# Patient Record
Sex: Female | Born: 1995 | Race: White | Hispanic: No | Marital: Single | Smoking: Light tobacco smoker
Health system: Southern US, Community
[De-identification: ages and names within clinical notes are randomized; demographics above are authoritative.]

---
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
## 2018-03-17 ENCOUNTER — Encounter: Payer: Self-pay | Admitting: Emergency Medicine

## 2018-03-17 ENCOUNTER — Other Ambulatory Visit: Payer: Self-pay

## 2018-03-17 ENCOUNTER — Emergency Department
Admission: EM | Admit: 2018-03-17 | Discharge: 2018-03-17 | Disposition: A | Discharge status: Home / Self Care | Payer: Self-pay | Attending: Emergency Medicine | Admitting: Emergency Medicine

## 2018-03-17 DIAGNOSIS — J111 Influenza due to unidentified influenza virus with other respiratory manifestations: Secondary | ICD-10-CM

## 2018-03-17 DIAGNOSIS — J1089 Influenza due to other identified influenza virus with other manifestations: Secondary | ICD-10-CM | POA: Insufficient documentation

## 2018-03-17 MED ORDER — OSELTAMIVIR PHOSPHATE 75 MG PO CAPS: 75.0000 mg | ORAL_CAPSULE | Freq: Two times a day (BID) | ORAL | 0 refills | Status: DC

## 2018-03-17 NOTE — ED Notes (Signed)
Pt states that her daughter was treated for the flu on Monday and she suspects that she may have it now as well. Pt states stomach pain, nausea, vomiting, and a cough. Pt is alert and oriented x 4.

## 2018-03-17 NOTE — ED Provider Notes (Signed)
[facility] Emergency Department Provider Note  ____________________________________________  Time seen: Approximately 7:40 PM  I have reviewed the triage vital signs and the nursing notes.   HISTORY  Chief Complaint Fever; Influenza; and Nausea    HPI Sara Castillo is a 23 y.o. female who presents the emergency department with body aches, fevers and chills, nasal congestion, sore throat, cough, emesis.  Patient reports that her daughter recently was diagnosed with influenza.  Patient has had symptoms x1 day.  Patient is concerned that she may have influenza as well.  She denies any headache, visual change, chest pain.  No medications prior to arrival.  No other complaints at this time.    History reviewed. No pertinent past medical history.  There are no active problems to display for this patient.   History reviewed. No pertinent surgical history.  Prior to Admission medications   Medication Sig Start Date End Date Taking? Authorizing Provider  oseltamivir (TAMIFLU) 75 MG capsule Take 1 capsule (75 mg total) by mouth 2 (two) times daily. 03/17/18   , Delorise Royals, PA-C    Allergies Patient has no known allergies.  No family history on file.  Social History Social History   Tobacco Use  . Smoking status: Never Smoker  . Smokeless tobacco: Never Used  Substance Use Topics  . Alcohol use: Not on file  . Drug use: Not on file     Review of Systems  Constitutional: Positive fever/chills.  Positive for body aches. Eyes: No visual changes. No discharge ENT: Positive for nasal congestion.  Cardiovascular: no chest pain. Respiratory: no cough. No SOB. Gastrointestinal: Diffuse abdominal pain.  Positive for nausea and vomiting.  No diarrhea.  No constipation. Genitourinary: Negative for dysuria. No hematuria Musculoskeletal: Negative for musculoskeletal pain. Skin: Negative for rash, abrasions, lacerations, ecchymosis. Neurological:  Negative for headaches, focal weakness or numbness. 10-point ROS otherwise negative.  ____________________________________________   PHYSICAL EXAM:  VITAL SIGNS: ED Triage Vitals  Enc Vitals Group     BP 03/17/18 1902 132/87     Pulse Rate 03/17/18 1902 (!) 108     Resp --      Temp 03/17/18 1902 98.4 F (36.9 C)     Temp Source 03/17/18 1902 Oral     SpO2 03/17/18 1902 98 %     Weight 03/17/18 1858 115 lb (52.2 kg)     Height 03/17/18 1858 5\' 6"  (1.676 m)     Head Circumference --      Peak Flow --      Pain Score 03/17/18 1857 6     Pain Loc --      Pain Edu? --      Excl. in GC? --      Constitutional: Alert and oriented. Well appearing and in no acute distress. Eyes: Conjunctivae are normal. PERRL. EOMI. Head: Atraumatic. ENT:      Ears: EACs and TMs unremarkable bilaterally.      Nose: Moderate clear congestion/rhinnorhea.      Mouth/Throat: Mucous membranes are moist.  Oropharynx is mildly erythematous.  Nonedematous.  Tonsils are mildly erythematous but nonedematous.  Use midline. Neck: No stridor.  Neck is supple full range of motion Hematological/Lymphatic/Immunilogical: Scattered, mobile, nontender anterior cervical lymphadenopathy. Cardiovascular: Normal rate, regular rhythm. Normal S1 and S2.  Good peripheral circulation. Respiratory: Normal respiratory effort without tachypnea or retractions. Lungs CTAB. Good air entry to the bases with no decreased or absent breath sounds. Gastrointestinal: Bowel sounds 4 quadrants. Soft and  nontender to palpation. No guarding or rigidity. No palpable masses. No distention. No CVA tenderness. Musculoskeletal: Full range of motion to all extremities. No gross deformities appreciated. Neurologic:  Normal speech and language. No gross focal neurologic deficits are appreciated.  Skin:  Skin is warm, dry and intact. No rash noted. Psychiatric: Mood and affect are normal. Speech and behavior are normal. Patient exhibits  appropriate insight and judgement.   ____________________________________________   LABS (all labs ordered are listed, but only abnormal results are displayed)  Labs Reviewed - No data to display ____________________________________________  EKG   ____________________________________________  RADIOLOGY   No results found.  ____________________________________________    PROCEDURES  Procedure(s) performed:    Procedures    Medications - No data to display   ____________________________________________   INITIAL IMPRESSION / ASSESSMENT AND PLAN / ED COURSE  Pertinent labs & imaging results that were available during my care of the patient were reviewed by me and considered in my medical decision making (see chart for details).  Review of the  [place] was performed in accordance of the [facility] prior to dispensing any controlled drugs.      Patient's diagnosis is consistent with influenza.  Patient presents emergency department with sudden onset of multiple flulike symptoms.  Patient has a close contact with her daughter who was diagnosed with flu.  Symptoms are consistent with flu.  At this time, patient will be treated empirically based off of symptoms and close contact.  Patient is prescribed Tamiflu.  Tylenol Motrin at home.  Plenty of fluids and rest at home.  Follow-up primary care as needed..  Patient is given ED precautions to return to the ED for any worsening or new symptoms.     ____________________________________________  FINAL CLINICAL IMPRESSION(S) / ED DIAGNOSES  Final diagnoses:  Influenza      NEW MEDICATIONS STARTED DURING THIS VISIT:  ED Discharge Orders         Ordered    oseltamivir (TAMIFLU) 75 MG capsule  2 times daily     03/17/18 2006              This chart was dictated using voice recognition software/Dragon. Despite best efforts to proofread, errors can occur which can change the meaning. Any change was purely  unintentional.    Lanette HampshireCuthriell,  D, PA-C 03/17/18 Vivien Rossetti2007    Siadecki, Sebastian, MD 03/17/18 2113

## 2018-03-17 NOTE — ED Triage Notes (Signed)
Arrives c/o cough, sinus congestion, abdominal pain.  States daughter recently had flu.  AAOx3.  Skin warm and dry. NAD

## 2019-01-06 ENCOUNTER — Ambulatory Visit: Payer: Self-pay | Admitting: Family Medicine

## 2019-01-06 ENCOUNTER — Ambulatory Visit (LOCAL_COMMUNITY_HEALTH_CENTER): Payer: Self-pay | Admitting: Family Medicine

## 2019-01-06 ENCOUNTER — Encounter: Payer: Self-pay | Admitting: Family Medicine

## 2019-01-06 ENCOUNTER — Other Ambulatory Visit: Payer: Self-pay

## 2019-01-06 VITALS — BP 123/71 | Ht 66.0 in | Wt 121.0 lb

## 2019-01-06 DIAGNOSIS — Z113 Encounter for screening for infections with a predominantly sexual mode of transmission: Secondary | ICD-10-CM

## 2019-01-06 DIAGNOSIS — A599 Trichomoniasis, unspecified: Secondary | ICD-10-CM

## 2019-01-06 LAB — WET PREP FOR TRICH, YEAST, CLUE
Trichomonas Exam: POSITIVE — AB
Yeast Exam: NEGATIVE

## 2019-01-06 NOTE — Progress Notes (Signed)
Pt here for [facility] Prob visit but pt reports that she is in a hurry and only wants STD screening today and will come back later for birth control.Ronny Bacon, RN

## 2019-01-06 NOTE — Progress Notes (Signed)
    STI clinic/screening visit  Subjective:  Sara Castillo is a 23 y.o. female being seen today for an STI screening visit. The patient reports they do have symptoms.  Patient has the following medical conditions:  There are no active problems to display for this patient.  Chief Complaint  Patient presents with  . SEXUALLY TRANSMITTED DISEASE    STD screening    HPI  Patient reports she is having clear/whiter disch with odor x 1 week.  States that she had unprotected sex 1 week ago with new partner and states afterwards her sympts began.  States that she has a h/o BV.  See flowsheet for further details and programmatic requirements.    The following portions of the patient's history were reviewed and updated as appropriate: allergies, current medications, past medical history, past social history, past surgical history and problem list.  Objective:   Vitals:   01/06/19 1437  BP: 123/71  Weight: 121 lb (54.9 kg)  Height: 5\' 6"  (1.676 m)    Physical Exam Constitutional:      Appearance: Normal appearance.  HENT:     Mouth/Throat:     Pharynx: Oropharynx is clear. No oropharyngeal exudate.  Neck:     Musculoskeletal: Neck supple. No muscular tenderness.  Abdominal:     General: There is no distension.     Palpations: Abdomen is soft.     Tenderness: There is no abdominal tenderness.  Genitourinary:    General: Normal vulva.     Vagina: Vaginal discharge present.     Comments: Thick, white disch adhering to vag walls, ph 4.5 + odor. Lymphadenopathy:     Cervical: No cervical adenopathy.  Skin:    General: Skin is warm and dry.     Findings: No lesion or rash.  Neurological:     Mental Status: She is alert.    Assessment and Plan:  MAKYNLIE ROSSINI is a 23 y.o. female presenting to the [facility] Department for STI screening  1. Screening examination for venereal disease  - WET PREP FOR [place], YEAST, CLUE - Chlamydia/Gonorrhea [place]  Lab  2. Trichomoniasis Metronidazole 500 mg bid x 7 days Co. That she could use Metro gel after sex and menses to evaluate if reduced episodes of BV. Metro gel vaginal insert 1 applicatorful intravaginally after sex and/or menses.  RF x 4.  Hand written RX given to client. Co. To always condoms for STD prevention Client will make appt for FP/OCPs      No follow-ups on file.  No future appointments.  Hassell Done, FNP

## 2019-01-07 NOTE — Progress Notes (Signed)
Wet mount reviewed. Patient tx'd for Trich and BV per Neville Route FNP VO. Gave Rx for metrogel per C. Latta FNP Aileen Fass, RN

## 2019-02-04 MED ORDER — METRONIDAZOLE 500 MG PO TABS: 500.0000 mg | ORAL_TABLET | Freq: Two times a day (BID) | ORAL | 0 refills | Status: AC

## 2019-02-04 NOTE — Addendum Note (Signed)
Addended by: Aileen Fass on: 02/04/2019 05:20 PM   Modules accepted: Orders

## 2019-02-04 NOTE — Addendum Note (Signed)
Addended by: Aileen Fass on: 02/04/2019 02:43 PM   Modules accepted: Orders

## 2019-06-22 ENCOUNTER — Other Ambulatory Visit: Payer: Self-pay

## 2019-06-22 ENCOUNTER — Ambulatory Visit: Payer: Self-pay | Admitting: Physician Assistant

## 2019-06-22 ENCOUNTER — Encounter: Payer: Self-pay | Admitting: Physician Assistant

## 2019-06-22 DIAGNOSIS — Z113 Encounter for screening for infections with a predominantly sexual mode of transmission: Secondary | ICD-10-CM

## 2019-06-22 LAB — WET PREP FOR TRICH, YEAST, CLUE
Trichomonas Exam: NEGATIVE
Yeast Exam: NEGATIVE

## 2019-06-22 NOTE — Progress Notes (Signed)
  [facility] Department STI clinic/screening visit  Subjective:  Sara Castillo is a 24 y.o. female being seen today for an STI screening visit. The patient reports they do have symptoms.  Patient reports that they do not desire a pregnancy in the next year.   They reported they are not interested in discussing contraception today.  No LMP recorded.   Patient has the following medical conditions:  There are no problems to display for this patient.   Chief Complaint  Patient presents with  . SEXUALLY TRANSMITTED DISEASE    HPI  Patient reports that she has had yellowish vaginal discharge with odor for a few days.  States that she is concerned that she seems to get BV every 3 months or so and it usually happens after her period.  States that LMP was 06/08/2019 and normal.  Using condoms always for [facility].  Patient elects to self-collect samples from vagina to save time today.  Declines blood work.  See flowsheet for further details and programmatic requirements.    The following portions of the patient's history were reviewed and updated as appropriate: allergies, current medications, past medical history, past social history, past surgical history and problem list.  Objective:  There were no vitals filed for this visit.  Physical Exam Constitutional:      General: She is not in acute distress.    Appearance: Normal appearance. She is normal weight.  HENT:     Head: Normocephalic and atraumatic.     Comments: No nits, lice or hair loss.  Eyes:     Conjunctiva/sclera: Conjunctivae normal.  Pulmonary:     Effort: Pulmonary effort is normal.  Neurological:     Mental Status: She is alert and oriented to person, place, and time.  Psychiatric:        Mood and Affect: Mood normal.        Behavior: Behavior normal.        Thought Content: Thought content normal.        Judgment: Judgment normal.    Counseled patient on how to self-collect samples for GC/Chlamydia and  wet mount.  Assessment and Plan:  Sara Castillo is a 24 y.o. female presenting to the [facility] Department for STI screening  1. Screening for STD (sexually transmitted disease) Patient into clinic with symptoms.  Declines blood work today. Patient self-collects samples for vaginal testing. Reviewed with patient below steps. Steps to prevent BV and yeast: Wear all-cotton underwear Sleep without underwear Take showers instead of baths Wear loose fitting clothing, especially during warm/hot weather Use a hair dryer on low after bathing to dry the area Avoid scented soaps and body washes Do not douche May try over the counter probiotics or boric acid gel or suppositories Stop smoking Rec condoms with all sex. Await test results.  Counseled that RN will call if needs to RTC for treatment once results are back. - WET PREP FOR TRICH, YEAST, CLUE - Chlamydia/Gonorrhea [place] [facility]     No follow-ups on file.  No future appointments.  Matt Holmes, PA

## 2020-04-15 DIAGNOSIS — N83209 Unspecified ovarian cyst, unspecified side: Secondary | ICD-10-CM

## 2020-04-15 HISTORY — DX: Unspecified ovarian cyst, unspecified side: N83.209

## 2020-04-16 ENCOUNTER — Other Ambulatory Visit: Payer: Self-pay

## 2020-04-16 ENCOUNTER — Emergency Department: Payer: Self-pay

## 2020-04-16 ENCOUNTER — Emergency Department
Admission: EM | Admit: 2020-04-16 | Discharge: 2020-04-17 | Disposition: A | Discharge status: Home / Self Care | Payer: Self-pay | Attending: Emergency Medicine | Admitting: Emergency Medicine

## 2020-04-16 DIAGNOSIS — F17210 Nicotine dependence, cigarettes, uncomplicated: Secondary | ICD-10-CM | POA: Insufficient documentation

## 2020-04-16 DIAGNOSIS — Z79899 Other long term (current) drug therapy: Secondary | ICD-10-CM | POA: Insufficient documentation

## 2020-04-16 DIAGNOSIS — R1032 Left lower quadrant pain: Secondary | ICD-10-CM | POA: Insufficient documentation

## 2020-04-16 LAB — URINALYSIS, ROUTINE W REFLEX MICROSCOPIC
Bilirubin Urine: NEGATIVE
Glucose, UA: NEGATIVE mg/dL
Hgb urine dipstick: NEGATIVE
Ketones, ur: NEGATIVE mg/dL
Leukocytes,Ua: NEGATIVE
Nitrite: NEGATIVE
Protein, ur: NEGATIVE mg/dL
Specific Gravity, Urine: 1.025 (ref 1.005–1.030)
pH: 7 (ref 5.0–8.0)

## 2020-04-16 LAB — COMPREHENSIVE METABOLIC PANEL
ALT: 12 U/L (ref 0–44)
AST: 18 U/L (ref 15–41)
Albumin: 4.3 g/dL (ref 3.5–5.0)
Alkaline Phosphatase: 91 U/L (ref 38–126)
Anion gap: 8 (ref 5–15)
BUN: 19 mg/dL (ref 6–20)
CO2: 24 mmol/L (ref 22–32)
Calcium: 8.8 mg/dL — ABNORMAL LOW (ref 8.9–10.3)
Chloride: 104 mmol/L (ref 98–111)
Creatinine, Ser: 0.58 mg/dL (ref 0.44–1.00)
GFR, Estimated: >60 mL/min (ref >60)
Glucose, Bld: 103 mg/dL — ABNORMAL HIGH (ref 70–99)
Potassium: 3.7 mmol/L (ref 3.5–5.1)
Sodium: 136 mmol/L (ref 135–145)
Total Bilirubin: 0.5 mg/dL (ref 0.3–1.2)
Total Protein: 7.6 g/dL (ref 6.5–8.1)

## 2020-04-16 LAB — CBC WITH DIFFERENTIAL/PLATELET
Abs Immature Granulocytes: 0.04 10*3/uL (ref 0.00–0.07)
Basophils Absolute: 0 10*3/uL (ref 0.0–0.1)
Basophils Relative: 0 %
Eosinophils Absolute: 0.1 10*3/uL (ref 0.0–0.5)
Eosinophils Relative: 1 %
HCT: 35 % — ABNORMAL LOW (ref 36.0–46.0)
Hemoglobin: 11.1 g/dL — ABNORMAL LOW (ref 12.0–15.0)
Immature Granulocytes: 1 %
Lymphocytes Relative: 22 %
Lymphs Abs: 1.9 10*3/uL (ref 0.7–4.0)
MCH: 29.4 pg (ref 26.0–34.0)
MCHC: 31.7 g/dL (ref 30.0–36.0)
MCV: 92.6 fL (ref 80.0–100.0)
Monocytes Absolute: 0.4 10*3/uL (ref 0.1–1.0)
Monocytes Relative: 4 %
Neutro Abs: 6.2 10*3/uL (ref 1.7–7.7)
Neutrophils Relative %: 72 %
Platelets: 231 10*3/uL (ref 150–400)
RBC: 3.78 MIL/uL — ABNORMAL LOW (ref 3.87–5.11)
RDW: 14.5 % (ref 11.5–15.5)
WBC: 8.7 10*3/uL (ref 4.0–10.5)
nRBC: 0 % (ref 0.0–0.2)

## 2020-04-16 LAB — WET PREP, GENITAL
Clue Cells Wet Prep HPF POC: NONE SEEN
Sperm: NONE SEEN
Trich, Wet Prep: NONE SEEN
Yeast Wet Prep HPF POC: NONE SEEN

## 2020-04-16 MED ORDER — IBUPROFEN 600 MG PO TABS
600.0000 mg | ORAL_TABLET | Freq: Once | ORAL | Status: AC
  Administered 2020-04-16: 600 mg via ORAL
  Filled 2020-04-16: qty 1

## 2020-04-16 NOTE — ED Notes (Signed)
POCT urine Negative

## 2020-04-16 NOTE — ED Triage Notes (Signed)
Pt from home, C/C Left lower abdominal pain that radiates to her lower part of her back. Bowl movement normal , stated ended her period on Monday , however having some light vaginal bleed today . Stated been having some irregular periods   .

## 2020-04-17 LAB — CHLAMYDIA/NGC RT PCR (ARMC ONLY)
Chlamydia Tr: NOT DETECTED
N gonorrhoeae: NOT DETECTED

## 2020-04-17 LAB — HCG, QUANTITATIVE, PREGNANCY: hCG, Beta Chain, Quant, S: <1 m[IU]/mL (ref <5)

## 2020-04-17 NOTE — ED Provider Notes (Signed)
[facility] Emergency Department Provider Note  ____________________________________________  Time seen: Approximately 12:42 AM  I have reviewed the triage vital signs and the nursing notes.   HISTORY  Chief Complaint Abdominal Pain (Onset : today aprox at 7pm . N/V .)   HPI Sara Castillo is a 25 y.o. female who presents for evaluation of abdominal pain.  She reports that the pain started around 7 PM.  The pain is in her lower abdomen worse on the left lower quadrant, sharp and constant, moderate in intensity.  She denies nausea, vomiting, diarrhea, constipation, dysuria, hematuria, fever or chills.  She reports that she recently finished her period a couple days ago but started having some spotting today again.     Prior to Admission medications   Medication Sig Start Date End Date Taking? Authorizing Provider  oseltamivir (TAMIFLU) 75 MG capsule Take 1 capsule (75 mg total) by mouth 2 (two) times daily. Patient not taking: Reported on 01/06/2019 03/17/18   Cuthriell, Delorise Royals, PA-C    Allergies Patient has no known allergies.  No family history on file.  Social History Social History   Tobacco Use  . Smoking status: Light Tobacco Smoker    Packs/day: 0.25    Types: Cigarettes  . Smokeless tobacco: Never Used  Substance Use Topics  . Alcohol use: Yes    Comment: occas.  . Drug use: Not Currently    Types: Marijuana    Review of Systems  Constitutional: Negative for fever. Eyes: Negative for visual changes. ENT: Negative for sore throat. Neck: No neck pain  Cardiovascular: Negative for chest pain. Respiratory: Negative for shortness of breath. Gastrointestinal: + lower abdominal pain. No vomiting or diarrhea. Genitourinary: Negative for dysuria. Musculoskeletal: Negative for back pain. Skin: Negative for rash. Neurological: Negative for headaches, weakness or numbness. Psych: No SI or  HI  ____________________________________________   PHYSICAL EXAM:  VITAL SIGNS: ED Triage Vitals  Enc Vitals Group     BP 04/16/20 2055 129/89     Pulse Rate 04/16/20 2055 72     Resp 04/16/20 2055 16     Temp 04/16/20 2055 98.7 F (37.1 C)     Temp Source 04/16/20 2055 Oral     SpO2 04/16/20 2055 100 %     Weight 04/16/20 2056 127 lb (57.6 kg)     Height 04/16/20 2056 5\' 6"  (1.676 m)     Head Circumference --      Peak Flow --      Pain Score 04/16/20 2056 10     Pain Loc --      Pain Edu? --      Excl. in GC? --     Constitutional: Alert and oriented. Well appearing and in no apparent distress. HEENT:      Head: Normocephalic and atraumatic.         Eyes: Conjunctivae are normal. Sclera is non-icteric.       Mouth/Throat: Mucous membranes are moist.       Neck: Supple with no signs of meningismus. Cardiovascular: Regular rate and rhythm. No murmurs, gallops, or rubs.  Respiratory: Normal respiratory effort. Lungs are clear to auscultation bilaterally.  Gastrointestinal: Soft, tender to palpation the left lower quadrant, and non distended with positive bowel sounds. No rebound or guarding. Pelvic exam: Normal external genitalia, no rashes or lesions. Normal cervical mucus. Os closed. No cervical motion tenderness.  L adnexa tenderness but no palpable masses.   Musculoskeletal:  No edema, cyanosis,  or erythema of extremities. Neurologic: Normal speech and language. Face is symmetric. Moving all extremities. No gross focal neurologic deficits are appreciated. Skin: Skin is warm, dry and intact. No rash noted. Psychiatric: Mood and affect are normal. Speech and behavior are normal.  ____________________________________________   LABS (all labs ordered are listed, but only abnormal results are displayed)  Labs Reviewed  WET PREP, GENITAL - Abnormal; Notable for the following components:      Result Value   WBC, Wet Prep HPF POC MODERATE (*)    All other components  within normal limits  COMPREHENSIVE METABOLIC PANEL - Abnormal; Notable for the following components:   Glucose, Bld 103 (*)    Calcium 8.8 (*)    All other components within normal limits  CBC WITH DIFFERENTIAL/PLATELET - Abnormal; Notable for the following components:   RBC 3.78 (*)    Hemoglobin 11.1 (*)    HCT 35.0 (*)    All other components within normal limits  URINALYSIS, ROUTINE W REFLEX MICROSCOPIC - Abnormal; Notable for the following components:   Color, Urine YELLOW (*)    APPearance HAZY (*)    All other components within normal limits  CHLAMYDIA/NGC RT PCR ([facility] ONLY)  HCG, QUANTITATIVE, PREGNANCY   ____________________________________________  EKG  none  ____________________________________________  RADIOLOGY  I have personally reviewed the images performed during this visit and I agree with the Radiologist's read.   Interpretation by Radiologist:  US PELVIC COMPLETE WITH TRANSVAGINAL  Result Date: 04/17/2020 CLINICAL DATA:  Left lower quadrant pain. EXAM: TRANSABDOMINAL AND TRANSVAGINAL ULTRASOUND OF PELVIS TECHNIQUE: Both transabdominal and transvaginal ultrasound examinations of the pelvis were performed. Transabdominal technique was performed for global imaging of the pelvis including uterus, ovaries, adnexal regions, and pelvic cul-de-sac. It was necessary to proceed with endovaginal exam following the transabdominal exam to visualize the bilateral ovaries. COMPARISON:  None FINDINGS: Uterus Measurements: 7.7 cm x 4.9 cm x 5.3 cm = volume: 103.6 mL. No fibroids or other mass visualized. Endometrium Thickness: 3.1 mm.  No focal abnormality visualized. Right ovary Measurements: 4.8 cm x 2.9 cm x 2.7 cm = volume: 19.5 mL. Normal appearance/no adnexal mass. Left ovary Measurements: 4.7 cm x 1.7 cm x 2.1 cm = volume: 8.7 mL. Normal appearance/no adnexal mass. Other findings A small amount of pelvic and right para ovarian free fluid is seen. IMPRESSION: Small  amount of pelvic and right para ovarian free fluid. Electronically Signed   By: Aram Candela M.D.   On: 04/17/2020 01:02      ____________________________________________   PROCEDURES  Procedure(s) performed: None Procedures Critical Care performed:  None ____________________________________________   INITIAL IMPRESSION / ASSESSMENT AND PLAN / ED COURSE   25 y.o. female who presents for evaluation of LLQ sharp constant abdominal pain since this evening.  Patient is well-appearing in no distress with normal vital signs, abdomen is soft with mild left lower quadrant tenderness but no rebound or guarding.  Pelvic exam showing small amount of blood in the vaginal vault with left adnexal tenderness but no palpable mass.  Ddx ectopic vs ovarian cyst vs torsion vs pregnancy vs uti vs std vs diverticulitis.  Patient given ibuprofen for pain.  Pelvic swabs pending.  Patient sent for a transvaginal ultrasound.  UA negative for UTI.  Pregnancy test negative.  No leukocytosis.  Unfortunately patient is requesting to be discharged before the results of her swabs and ultrasound.  I discussed with the patient that if she has an ovarian torsion this is an  emergency and leaving before those results may lose an ovarian loss.  Also concerned if she has an STD about delayed care.  She is here with her husband and they are saying that they have to go pick up their daughter.  I urged the husband to go pick up the daughter and return for the patient but they both decided that they do not want to stay.  She understands the risks of leaving without full evaluation.  I will try to contact them if the results come back with some abnormality.  Otherwise I encouraged her to see her results on MyChart and follow-up with her primary care doctor.  I also told them that they are welcome to return at any time for continuation of care if they change their mind.  _________________________ 1:49 AM on  04/17/2020 -----------------------------------------  Pelvic swabs with no signs of STDs.  Ultrasound showing small amount of pelvic and right periovarian free fluid but no signs of torsion or cysts and no abnormality seen on the left lower quadrant.  Unable to reach patient over the phone to discuss these results.  Unfortunately since he left AMA unable to do any further imaging to try to determine the cause of her pain.    _____________________________________________ Please note:  Patient was evaluated in Emergency Department today for the symptoms described in the history of present illness. Patient was evaluated in the context of the global COVID-19 pandemic, which necessitated consideration that the patient might be at risk for infection with the SARS-CoV-2 virus that causes COVID-19. Institutional protocols and algorithms that pertain to the evaluation of patients at risk for COVID-19 are in a state of rapid change based on information released by regulatory bodies including the CDC and federal and state organizations. These policies and algorithms were followed during the patient's care in the ED.  Some ED evaluations and interventions may be delayed as a result of limited staffing during the pandemic.   [place] Controlled Substance Database was reviewed by me. ____________________________________________   FINAL CLINICAL IMPRESSION(S) / ED DIAGNOSES   Final diagnoses:  LLQ abdominal pain      NEW MEDICATIONS STARTED DURING THIS VISIT:  ED Discharge Orders    None       Note:  This document was prepared using Dragon voice recognition software and may include unintentional dictation errors.    Don Perking, [place], MD 04/17/20 [phone]

## 2020-04-17 NOTE — ED Notes (Signed)
US at bedside

## 2020-04-17 NOTE — Discharge Instructions (Addendum)
At this point unfortunately we cannot tell you the reason of your pain since you decided to leave before your evaluation was done.  I will try to contact you with the results of your ultrasound and your pelvic swabs.  Make sure to check my chart for these results as well.  If you change your mind and wishes to continue evaluation.  Please return at any time.  If you do have something concerning that require surgery that may lead to more severe surgery, loss of ovary, or more severe infection.

## 2020-05-02 ENCOUNTER — Ambulatory Visit
Admission: EM | Admit: 2020-05-02 | Discharge: 2020-05-02 | Disposition: A | Discharge status: Home / Self Care | Payer: Self-pay | Attending: Emergency Medicine | Admitting: Emergency Medicine

## 2020-05-02 ENCOUNTER — Other Ambulatory Visit: Payer: Self-pay

## 2020-05-02 ENCOUNTER — Encounter: Payer: Self-pay | Admitting: Emergency Medicine

## 2020-05-02 DIAGNOSIS — N760 Acute vaginitis: Secondary | ICD-10-CM | POA: Insufficient documentation

## 2020-05-02 DIAGNOSIS — B9689 Other specified bacterial agents as the cause of diseases classified elsewhere: Secondary | ICD-10-CM | POA: Insufficient documentation

## 2020-05-02 LAB — COMPREHENSIVE METABOLIC PANEL
ALT: 12 U/L (ref 0–44)
AST: 17 U/L (ref 15–41)
Albumin: 4.3 g/dL (ref 3.5–5.0)
Alkaline Phosphatase: 90 U/L (ref 38–126)
Anion gap: 7 (ref 5–15)
BUN: 12 mg/dL (ref 6–20)
CO2: 25 mmol/L (ref 22–32)
Calcium: 9.3 mg/dL (ref 8.9–10.3)
Chloride: 104 mmol/L (ref 98–111)
Creatinine, Ser: 0.57 mg/dL (ref 0.44–1.00)
GFR, Estimated: >60 mL/min (ref >60)
Glucose, Bld: 82 mg/dL (ref 70–99)
Potassium: 4.1 mmol/L (ref 3.5–5.1)
Sodium: 136 mmol/L (ref 135–145)
Total Bilirubin: 0.3 mg/dL (ref 0.3–1.2)
Total Protein: 8 g/dL (ref 6.5–8.1)

## 2020-05-02 LAB — CBC WITH DIFFERENTIAL/PLATELET
Abs Immature Granulocytes: 0.01 10*3/uL (ref 0.00–0.07)
Basophils Absolute: 0 10*3/uL (ref 0.0–0.1)
Basophils Relative: 1 %
Eosinophils Absolute: 0.1 10*3/uL (ref 0.0–0.5)
Eosinophils Relative: 2 %
HCT: 37.9 % (ref 36.0–46.0)
Hemoglobin: 12.2 g/dL (ref 12.0–15.0)
Immature Granulocytes: 0 %
Lymphocytes Relative: 45 %
Lymphs Abs: 2.2 10*3/uL (ref 0.7–4.0)
MCH: 29.5 pg (ref 26.0–34.0)
MCHC: 32.2 g/dL (ref 30.0–36.0)
MCV: 91.8 fL (ref 80.0–100.0)
Monocytes Absolute: 0.3 10*3/uL (ref 0.1–1.0)
Monocytes Relative: 7 %
Neutro Abs: 2.2 10*3/uL (ref 1.7–7.7)
Neutrophils Relative %: 45 %
Platelets: 241 10*3/uL (ref 150–400)
RBC: 4.13 MIL/uL (ref 3.87–5.11)
RDW: 14.1 % (ref 11.5–15.5)
WBC: 4.8 10*3/uL (ref 4.0–10.5)
nRBC: 0 % (ref 0.0–0.2)

## 2020-05-02 LAB — URINALYSIS, COMPLETE (UACMP) WITH MICROSCOPIC
Bilirubin Urine: NEGATIVE
Glucose, UA: NEGATIVE mg/dL
Hgb urine dipstick: NEGATIVE
Ketones, ur: NEGATIVE mg/dL
Leukocytes,Ua: NEGATIVE
Nitrite: NEGATIVE
Protein, ur: NEGATIVE mg/dL
Specific Gravity, Urine: >1.03 — ABNORMAL HIGH (ref 1.005–1.030)
pH: 5.5 (ref 5.0–8.0)

## 2020-05-02 LAB — WET PREP, GENITAL
Sperm: NONE SEEN
Trich, Wet Prep: NONE SEEN
Yeast Wet Prep HPF POC: NONE SEEN

## 2020-05-02 LAB — PREGNANCY, URINE: Preg Test, Ur: NEGATIVE

## 2020-05-02 MED ORDER — METRONIDAZOLE 500 MG PO TABS: 500.0000 mg | ORAL_TABLET | Freq: Two times a day (BID) | ORAL | 0 refills | Status: DC

## 2020-05-02 NOTE — ED Provider Notes (Signed)
[facility]    CSN: [id number] Arrival date & time: 05/02/20  1428      History   Chief Complaint Chief Complaint  Patient presents with  . Abdominal Pain    HPI Sara Castillo is a 25 y.o. female.   HPI   25 year old female here for evaluation of lower abdominal pain.  Patient reports that she developed some vaginal spotting, a yellow vaginal discharge, and lower abdominal pain today.  She reports that 2 weeks ago she had a left ovarian cyst that ruptured and she was evaluated in the emergency department at that time.  Patient reports that she has had associated symptoms of nausea and also increased pressure with urination.  Patient denies fever or vomiting.  Patient reports that she does have a history of BV that is recurrent approximately every 3 months and she was last treated for BV 2 months ago.  Patient states that she is not currently sexually active and her last sexual encounter was 3 months ago.  She states that the encounter was protected sex and she has since had STI testing so she does not feel that is necessary at this time.  History reviewed. No pertinent past medical history.  There are no problems to display for this patient.   History reviewed. No pertinent surgical history.  OB History   No obstetric history on file.      Home Medications    Prior to Admission medications   Medication Sig Start Date End Date Taking? Authorizing Provider  metroNIDAZOLE (FLAGYL) 500 MG tablet Take 1 tablet (500 mg total) by mouth 2 (two) times daily. 05/02/20  Yes Becky Augusta, NP    Family History Family History  Problem Relation Age of Onset  . Healthy Mother   . Healthy Father     Social History Social History   Tobacco Use  . Smoking status: Light Tobacco Smoker    Packs/day: 0.25    Types: Cigarettes  . Smokeless tobacco: Never Used  Substance Use Topics  . Alcohol use: Yes    Comment: occas.  . Drug use: Not Currently    Types:  Marijuana     Allergies   Patient has no known allergies.   Review of Systems Review of Systems  Gastrointestinal: Positive for abdominal pain and nausea. Negative for diarrhea and vomiting.  Genitourinary: Positive for frequency, urgency and vaginal discharge. Negative for dysuria and hematuria.  Skin: Negative for rash.  Hematological: Negative.   Psychiatric/Behavioral: Negative.      Physical Exam Triage Vital Signs ED Triage Vitals  Enc Vitals Group     BP 05/02/20 1511 122/80     Pulse Rate 05/02/20 1511 78     Resp 05/02/20 1511 18     Temp 05/02/20 1511 99.4 F (37.4 C)     Temp Source 05/02/20 1511 Oral     SpO2 05/02/20 1511 100 %     Weight 05/02/20 1509 127 lb (57.6 kg)     Height 05/02/20 1509 5\' 6"  (1.676 m)     Head Circumference --      Peak Flow --      Pain Score 05/02/20 1509 7     Pain Loc --      Pain Edu? --      Excl. in GC? --    No data found.  Updated Vital Signs BP 122/80 (BP Location: Right Arm)   Pulse 78   Temp 99.4 F (37.4 C) (Oral)  Resp 18   Ht 5\' 6"  (1.676 m)   Wt 127 lb (57.6 kg)   LMP 04/11/2020   SpO2 100%   BMI 20.50 kg/m   Visual Acuity Right Eye Distance:   Left Eye Distance:   Bilateral Distance:    Right Eye Near:   Left Eye Near:    Bilateral Near:     Physical Exam Vitals and nursing note reviewed.  Constitutional:      General: She is not in acute distress.    Appearance: She is well-developed. She is not ill-appearing.  HENT:     Head: Normocephalic and atraumatic.  Cardiovascular:     Rate and Rhythm: Normal rate and regular rhythm.     Heart sounds: Normal heart sounds. No murmur heard. No gallop.   Pulmonary:     Effort: Pulmonary effort is normal.     Breath sounds: Normal breath sounds. No wheezing, rhonchi or rales.  Abdominal:     General: Abdomen is flat. Bowel sounds are normal. There is no distension. There are no signs of injury.     Palpations: Abdomen is soft. There is no  hepatomegaly or splenomegaly.     Tenderness: There is abdominal tenderness in the right upper quadrant, right lower quadrant, suprapubic area, left upper quadrant and left lower quadrant. There is no right CVA tenderness, left CVA tenderness, guarding or rebound.     Hernia: No hernia is present.  Skin:    General: Skin is warm and dry.     Capillary Refill: Capillary refill takes less than 2 seconds.  Neurological:     General: No focal deficit present.     Mental Status: She is alert and oriented to person, place, and time.  Psychiatric:        Mood and Affect: Mood normal.        Behavior: Behavior normal.      UC Treatments / Results  Labs (all labs ordered are listed, but only abnormal results are displayed) Labs Reviewed  WET PREP, GENITAL - Abnormal; Notable for the following components:      Result Value   Clue Cells Wet Prep HPF POC PRESENT (*)    WBC, Wet Prep HPF POC MODERATE (*)    All other components within normal limits  URINALYSIS, COMPLETE (UACMP) WITH MICROSCOPIC - Abnormal; Notable for the following components:   Specific Gravity, Urine >1.030 (*)    Bacteria, UA FEW (*)    All other components within normal limits  CBC WITH DIFFERENTIAL/PLATELET  COMPREHENSIVE METABOLIC PANEL  PREGNANCY, URINE    EKG   Radiology No results found.  Procedures Procedures (including critical care time)  Medications Ordered in UC Medications - No data to display  Initial Impression / Assessment and Plan / UC Course  I have reviewed the triage vital signs and the nursing notes.  Pertinent labs & imaging results that were available during my care of the patient were reviewed by me and considered in my medical decision making (see chart for details).   Patient is a very pleasant 25 year old female here for evaluation of lower abdominal pain and vaginal spotting that started today.  Patient does not appear to be in acute distress at time of assessment.  She reports that  2 weeks ago she had an ovarian cyst on her left that ruptured and she was evaluated in the emergency department at that time.  Patient reports that today she had return of left lower abdominal pain as well  as some bladder pressure with urination urgency, frequency, and a yellow vaginal discharge.  Physical exam reveals a soft, flat abdomen with positive bowel sounds in all quadrants.  Patient has tenderness and right upper quadrant, left upper quadrant, left lower quadrant, suprapubically, and right lower quadrant.  Patient does not have any rebound or guarding.  Patient does have a history of BV.  Will check UA, wet prep, urine pregnancy test, CBC, and CMP.  I discussed with patient that if any of her lab work comes up abnormal that would indicate the need for complex abdominal imaging that we would have to send her to the emergency department.  CBC is unremarkable.  CMP is unremarkable.  Urinalysis shows few bacteria but otherwise unremarkable.  Your pregnancy test is negative.  Wet prep is positive for clue cells.  It is negative for yeast or trichomoniasis.  We will treat patient for BV with metronidazole 500 mg twice daily for 7 days.     Final Clinical Impressions(s) / UC Diagnoses   Final diagnoses:  Bacterial vaginosis     Discharge Instructions     Testing today did not reveal the presence of any significant infection systemically or in your urine.  It did show the presence of bacterial vaginosis.  The BV can be causing your generalized abdominal pain as well as your pelvic pain especially given the fact that she just had a ruptured ovarian cyst 2 weeks ago in that area and the area may still be inflamed.  Take the Flagyl twice daily for 7 days for treatment of the bacterial vaginosis.  If your symptoms worsen, including worsening pain, fever, nausea or vomiting return for reevaluation or go to the emergency department.    ED Prescriptions    Medication Sig Dispense  Auth. Provider   metroNIDAZOLE (FLAGYL) 500 MG tablet Take 1 tablet (500 mg total) by mouth 2 (two) times daily. 14 tablet Becky Augusta, NP     PDMP not reviewed this encounter.   Becky Augusta, NP 05/02/20 1702

## 2020-05-02 NOTE — Discharge Instructions (Addendum)
Testing today did not reveal the presence of any significant infection systemically or in your urine.  It did show the presence of bacterial vaginosis.  The BV can be causing your generalized abdominal pain as well as your pelvic pain especially given the fact that she just had a ruptured ovarian cyst 2 weeks ago in that area and the area may still be inflamed.  Take the Flagyl twice daily for 7 days for treatment of the bacterial vaginosis.  If your symptoms worsen, including worsening pain, fever, nausea or vomiting return for reevaluation or go to the emergency department.

## 2020-05-02 NOTE — ED Triage Notes (Signed)
Patient states she had a cyst on her ovary that ruptured about 2 weeks ago. Today she states she is having vaginal spotting and abdominal pain that started today.

## 2020-05-09 ENCOUNTER — Ambulatory Visit: Payer: Self-pay

## 2020-08-01 ENCOUNTER — Ambulatory Visit: Payer: Self-pay | Admitting: Physician Assistant

## 2020-08-01 ENCOUNTER — Other Ambulatory Visit: Payer: Self-pay

## 2020-08-01 DIAGNOSIS — Z30018 Encounter for initial prescription of other contraceptives: Secondary | ICD-10-CM

## 2020-08-01 DIAGNOSIS — B9689 Other specified bacterial agents as the cause of diseases classified elsewhere: Secondary | ICD-10-CM

## 2020-08-01 DIAGNOSIS — N760 Acute vaginitis: Secondary | ICD-10-CM

## 2020-08-01 DIAGNOSIS — Z113 Encounter for screening for infections with a predominantly sexual mode of transmission: Secondary | ICD-10-CM

## 2020-08-01 DIAGNOSIS — Z3009 Encounter for other general counseling and advice on contraception: Secondary | ICD-10-CM

## 2020-08-01 LAB — WET PREP FOR TRICH, YEAST, CLUE
Trichomonas Exam: NEGATIVE
Yeast Exam: NEGATIVE

## 2020-08-01 MED ORDER — METRONIDAZOLE 500 MG PO TABS: 500.0000 mg | ORAL_TABLET | Freq: Two times a day (BID) | ORAL | 0 refills | Status: AC

## 2020-08-01 MED ORDER — ETONOGESTREL-ETHINYL ESTRADIOL 0.12-0.015 MG/24HR VA RING: VAGINAL_RING | VAGINAL | 2 refills | Status: DC

## 2020-08-02 ENCOUNTER — Encounter: Payer: Self-pay | Admitting: Physician Assistant

## 2020-08-02 NOTE — Progress Notes (Signed)
[facility] Department STI clinic/screening visit  Subjective:  Sara Castillo is a 25 y.o. female being seen today for an STI screening visit. The patient reports they do have symptoms.  Patient reports that they do not desire a pregnancy in the next year.   They reported they are interested in discussing contraception today.  No LMP recorded.   Patient has the following medical conditions:  There are no problems to display for this patient.   Chief Complaint  Patient presents with  . SEXUALLY TRANSMITTED DISEASE    Screening    HPI  Patient reports that she found out that her partner has had other partners and she would like to be checked.  Also, states that she has had a vaginal odor and thinks she may have BV.  Reports LMP 07/13/2020 and normal and using condoms as her current BCM.  States that she has tried OCs, Depo, and the patch and has had continuous bleeding on all of these methods.  States that she is not interested in the Nexplanon or an IUD.  Reports last HIV test was in 2021 and last pap was in February of this year.   See flowsheet for further details and programmatic requirements.    The following portions of the patient's history were reviewed and updated as appropriate: allergies, current medications, past medical history, past social history, past surgical history and problem list.  Objective:  There were no vitals filed for this visit.  Physical Exam Constitutional:      General: She is not in acute distress.    Appearance: Normal appearance.  HENT:     Head: Normocephalic and atraumatic.     Comments: No nits,lice, or hair loss. No cervical, supraclavicular or axillary adenopathy.    Mouth/Throat:     Mouth: Mucous membranes are moist.     Pharynx: Oropharynx is clear. No oropharyngeal exudate or posterior oropharyngeal erythema.  Eyes:     Conjunctiva/sclera: Conjunctivae normal.  Pulmonary:     Effort: Pulmonary effort is normal.   Abdominal:     Palpations: Abdomen is soft. There is no mass.     Tenderness: There is no abdominal tenderness. There is no guarding or rebound.  Genitourinary:    General: Normal vulva.     Rectum: Normal.     Comments: External genitalia/pubic area without nits, lice, edema, erythema, lesions and inguinal adenopathy. Vagina with normal mucosa and moderate amount of thin, brownish discharge, pH=>4.5. Cervix without visible lesions. Uterus firm, mobile, nt, no masses, no CMT, no adnexal tenderness or fullness. Musculoskeletal:     Cervical back: Neck supple. No tenderness.  Skin:    General: Skin is warm and dry.     Findings: No bruising, erythema, lesion or rash.  Neurological:     Mental Status: She is alert and oriented to person, place, and time.  Psychiatric:        Mood and Affect: Mood normal.        Behavior: Behavior normal.        Thought Content: Thought content normal.        Judgment: Judgment normal.      Assessment and Plan:  Sara Castillo is a 25 y.o. female presenting to the [facility] Department for STI screening  1. Screening for STD (sexually transmitted disease) Patient into clinic without symptoms. Rec condoms with all sex. Await test results.  Counseled that RN will call if needs to RTC for treatment once results  are back. - WET PREP FOR TRICH, YEAST, CLUE - Gonococcus culture - Chlamydia/Gonorrhea [facility] - HIV [facility] - Syphilis Serology, [facility]  2. Encounter for counseling regarding contraception Counseled patient re:  Risks, benefits, and SE of all hormonal BCMs. Patient opts to try the NuvaRing today. Enc condoms with all sex for STD protection.   3. BV (bacterial vaginosis) Treat BV with Metronidazole 500 mg #14 1 po BID for 7 days with food, no EtOH for 24 hr before and until 72 hr after completing medicine. No sex for 10 days. Enc OTC antifungal cream if has itching during or after antibiotic use. -  metroNIDAZOLE (FLAGYL) 500 MG tablet; Take 1 tablet (500 mg total) by mouth 2 (two) times daily for 7 days.  Dispense: 14 tablet; Refill: 0  4. Encounter for prescription for nuvaring Rx for NuvaRing with refills X 2.  Counseled patient to start ring with onset of next menses. Reviewed with patient consent form and patient signed. Patient counseled that in order for [place] to continue to prescribe this as a BCM she will need to have a [facility] IP/RP visit prior to refills running out. Call with questions or concerns. BP in clinic was 122/82, Weight was 117.6 lbs, and Height 66 inches. - etonogestrel-ethinyl estradiol (NUVARING) 0.12-0.015 MG/24HR vaginal ring; Insert vaginally and leave in place for 3 consecutive weeks, then remove for 1 week.  Dispense: 1 each; Refill: 2     No follow-ups on file.  No future appointments.  Matt Holmes, PA

## 2020-08-05 ENCOUNTER — Telehealth: Payer: Self-pay

## 2020-08-05 LAB — GONOCOCCUS CULTURE

## 2020-08-05 LAB — HM HIV SCREENING LAB: HM HIV Screening: NEGATIVE

## 2020-08-05 NOTE — Telephone Encounter (Signed)
PC to pt regarding GC/CL test. Chlamydia test was indeterminate and recommend retest. Made appt for 6/14 @805am .

## 2020-08-06 NOTE — Progress Notes (Signed)
Chart reviewed by Pharmacist  Suzanne Walker PharmD, Contract Pharmacist at [facility]  

## 2020-08-09 ENCOUNTER — Ambulatory Visit: Payer: Self-pay | Admitting: Advanced Practice Midwife

## 2020-08-09 ENCOUNTER — Other Ambulatory Visit: Payer: Self-pay

## 2020-08-09 DIAGNOSIS — Z113 Encounter for screening for infections with a predominantly sexual mode of transmission: Secondary | ICD-10-CM

## 2020-08-09 NOTE — Progress Notes (Signed)
Pt here for recollection of GC/Chlamydia due to 08/01/20 culture being "indeterminant". Pt elects to self collect as she is in a hurry and has to go to work

## 2020-08-18 ENCOUNTER — Ambulatory Visit: Payer: Self-pay

## 2020-08-22 ENCOUNTER — Encounter: Payer: Self-pay | Admitting: Physician Assistant

## 2020-08-22 NOTE — Progress Notes (Signed)
Patient has had 2 indeterminate results on Chlamydia NAAT testing at [facility].   The first was 08/02/2020 and the second was 08/09/2020.  Patient was counseled by C. Gerilyn Pilgrim, RN on these results and patient had an appointment on 08/18/2020 for another screening test with the plan to send the sample for testing at [facility] instead of [facility].  Per C.Gerilyn Pilgrim, RN note from conversation with [facility]; the threshold for a positive is 100.  Per [facility] personal the results from 08/02/2020 were 74, but they do not have a number from the 08/09/2020 test.  The [facility] recommends doing a "full panel" with samples from vagina, urine, rectum and pharyngeal samples.  If/when patient RTC for testing, will send GC/Chlamydia NAAT to [facility].

## 2021-02-11 ENCOUNTER — Other Ambulatory Visit: Payer: Self-pay

## 2021-02-11 ENCOUNTER — Emergency Department: Payer: Self-pay

## 2021-02-11 ENCOUNTER — Encounter: Payer: Self-pay | Admitting: Emergency Medicine

## 2021-02-11 ENCOUNTER — Emergency Department
Admission: EM | Admit: 2021-02-11 | Discharge: 2021-02-11 | Disposition: A | Discharge status: Home / Self Care | Payer: Self-pay | Attending: Emergency Medicine | Admitting: Emergency Medicine

## 2021-02-11 DIAGNOSIS — Z20822 Contact with and (suspected) exposure to covid-19: Secondary | ICD-10-CM | POA: Insufficient documentation

## 2021-02-11 DIAGNOSIS — J029 Acute pharyngitis, unspecified: Secondary | ICD-10-CM

## 2021-02-11 DIAGNOSIS — J028 Acute pharyngitis due to other specified organisms: Secondary | ICD-10-CM | POA: Insufficient documentation

## 2021-02-11 DIAGNOSIS — B9789 Other viral agents as the cause of diseases classified elsewhere: Secondary | ICD-10-CM | POA: Insufficient documentation

## 2021-02-11 DIAGNOSIS — F17210 Nicotine dependence, cigarettes, uncomplicated: Secondary | ICD-10-CM | POA: Insufficient documentation

## 2021-02-11 LAB — BASIC METABOLIC PANEL
Anion gap: 5 (ref 5–15)
BUN: 15 mg/dL (ref 6–20)
CO2: 26 mmol/L (ref 22–32)
Calcium: 8.7 mg/dL — ABNORMAL LOW (ref 8.9–10.3)
Chloride: 107 mmol/L (ref 98–111)
Creatinine, Ser: 0.67 mg/dL (ref 0.44–1.00)
GFR, Estimated: >60 mL/min (ref >60)
Glucose, Bld: 77 mg/dL (ref 70–99)
Potassium: 3.7 mmol/L (ref 3.5–5.1)
Sodium: 138 mmol/L (ref 135–145)

## 2021-02-11 LAB — CBC WITH DIFFERENTIAL/PLATELET
Abs Immature Granulocytes: 0.01 10*3/uL (ref 0.00–0.07)
Basophils Absolute: 0 10*3/uL (ref 0.0–0.1)
Basophils Relative: 1 %
Eosinophils Absolute: 0.1 10*3/uL (ref 0.0–0.5)
Eosinophils Relative: 1 %
HCT: 36 % (ref 36.0–46.0)
Hemoglobin: 11.7 g/dL — ABNORMAL LOW (ref 12.0–15.0)
Immature Granulocytes: 0 %
Lymphocytes Relative: 18 %
Lymphs Abs: 1.5 10*3/uL (ref 0.7–4.0)
MCH: 30.5 pg (ref 26.0–34.0)
MCHC: 32.5 g/dL (ref 30.0–36.0)
MCV: 93.8 fL (ref 80.0–100.0)
Monocytes Absolute: 0.5 10*3/uL (ref 0.1–1.0)
Monocytes Relative: 7 %
Neutro Abs: 6 10*3/uL (ref 1.7–7.7)
Neutrophils Relative %: 73 %
Platelets: 256 10*3/uL (ref 150–400)
RBC: 3.84 MIL/uL — ABNORMAL LOW (ref 3.87–5.11)
RDW: 13.6 % (ref 11.5–15.5)
WBC: 8.1 10*3/uL (ref 4.0–10.5)
nRBC: 0 % (ref 0.0–0.2)

## 2021-02-11 LAB — RESP PANEL BY RT-PCR (FLU A&B, COVID) ARPGX2
Influenza A by PCR: NEGATIVE
Influenza B by PCR: NEGATIVE
SARS Coronavirus 2 by RT PCR: NEGATIVE

## 2021-02-11 LAB — GROUP A STREP BY PCR: Group A Strep by PCR: NOT DETECTED

## 2021-02-11 MED ORDER — DEXAMETHASONE SODIUM PHOSPHATE 10 MG/ML IJ SOLN
10.0000 mg | Freq: Once | INTRAMUSCULAR | Status: AC
  Administered 2021-02-11: 10 mg via INTRAVENOUS
  Filled 2021-02-11: qty 1

## 2021-02-11 MED ORDER — IOHEXOL 300 MG/ML  SOLN
100.0000 mL | Freq: Once | INTRAMUSCULAR | Status: AC | PRN
  Administered 2021-02-11: 100 mL via INTRAVENOUS
  Filled 2021-02-11: qty 100

## 2021-02-11 MED ORDER — KETOROLAC TROMETHAMINE 15 MG/ML IJ SOLN
15.0000 mg | Freq: Once | INTRAMUSCULAR | Status: AC
  Administered 2021-02-11: 15 mg via INTRAVENOUS
  Filled 2021-02-11: qty 1

## 2021-02-11 NOTE — ED Provider Notes (Signed)
[facility]  ____________________________________________   Event Date/Time   First MD Initiated Contact with Patient 02/11/21 [phone]     (approximate)  I have reviewed the triage vital signs and the nursing notes.   HISTORY  Chief Complaint Sore Throat, Shortness of Breath, and Laryngitis    HPI Sara Castillo is a 25 y.o. female with no significant past medical history presents with sore throat.  Symptoms been going on for the past 4 days.  She has associated hoarseness.  Pain is worse on the right side.  Denies fevers or chills.  Has not had cough.  Has had significant difficulty eating and drinking, only able to drink hot chocolate.  Denies nausea vomiting abdominal pain.  No shortness of breath.         History reviewed. No pertinent past medical history.  There are no problems to display for this patient.   History reviewed. No pertinent surgical history.  Prior to Admission medications   Medication Sig Start Date End Date Taking? Authorizing Provider  etonogestrel-ethinyl estradiol (NUVARING) 0.12-0.015 MG/24HR vaginal ring Insert vaginally and leave in place for 3 consecutive weeks, then remove for 1 week. Patient not taking: Reported on 08/09/2020 08/01/20   Matt Holmes, PA  metroNIDAZOLE (FLAGYL) 500 MG tablet Take 1 tablet (500 mg total) by mouth 2 (two) times daily. Patient not taking: Reported on 08/09/2020 05/02/20   Becky Augusta, NP    Allergies Patient has no known allergies.  Family History  Problem Relation Age of Onset   Healthy Mother    Healthy Father     Social History Social History   Tobacco Use   Smoking status: Light Smoker    Packs/day: 0.25    Types: Cigarettes   Smokeless tobacco: Never  Substance Use Topics   Alcohol use: Yes    Comment: occas.   Drug use: Not Currently    Types: Marijuana    Review of Systems   Review of Systems  Constitutional:  Positive for appetite change. Negative for fever.   HENT:  Positive for sore throat, trouble swallowing and voice change. Negative for congestion.   Respiratory:  Negative for cough and shortness of breath.   Gastrointestinal:  Negative for abdominal pain, diarrhea, nausea and vomiting.  All other systems reviewed and are negative.  Physical Exam Updated Vital Signs BP (!) 142/96 (BP Location: Left Arm)    Pulse 76    Temp 98.3 F (36.8 C) (Oral)    Resp 18    Ht 5\' 6"  (1.676 m)    Wt 58 kg    LMP 02/11/2021 (Exact Date)    SpO2 100%    BMI 20.64 kg/m   Physical Exam Vitals and nursing note reviewed.  Constitutional:      General: She is not in acute distress.    Appearance: Normal appearance.  HENT:     Head: Normocephalic and atraumatic.     Mouth/Throat:     Comments: Bilateral tonsillar erythema, slight fullness of the right tonsillar pillar, no exudate, uvula is midline, no trismus Eyes:     General: No scleral icterus.    Conjunctiva/sclera: Conjunctivae normal.  Pulmonary:     Effort: Pulmonary effort is normal. No respiratory distress.     Breath sounds: No stridor.  Musculoskeletal:        General: No deformity or signs of injury.     Cervical back: Normal range of motion.  Skin:    General: Skin is  dry.     Coloration: Skin is not jaundiced or pale.  Neurological:     General: No focal deficit present.     Mental Status: She is alert and oriented to person, place, and time. Mental status is at baseline.  Psychiatric:        Mood and Affect: Mood normal.        Behavior: Behavior normal.     LABS (all labs ordered are listed, but only abnormal results are displayed)  Labs Reviewed  CBC WITH DIFFERENTIAL/PLATELET - Abnormal; Notable for the following components:      Result Value   RBC 3.84 (*)    Hemoglobin 11.7 (*)    All other components within normal limits  BASIC METABOLIC PANEL - Abnormal; Notable for the following components:   Calcium 8.7 (*)    All other components within normal limits  GROUP A  STREP BY PCR  RESP PANEL BY RT-PCR (FLU A&B, COVID) [id number]   ____________________________________________  EKG  N/a ____________________________________________  RADIOLOGY Ky Barban, personally viewed and evaluated these images (plain radiographs) as part of my medical decision making, as well as reviewing the written report by the radiologist.  ED MD interpretation: I reviewed the CT of the neck with contrast which does not show any abscess or other abnormality, just prominent tonsils    ____________________________________________   PROCEDURES  Procedure(s) performed (including Critical Care):  Procedures   ____________________________________________   INITIAL IMPRESSION / ASSESSMENT AND PLAN / ED COURSE     Patient is a 25 year old female presenting with sore throat times several days as well as hoarseness.  Pain is primarily on the right side.  She appears somewhat fatigued on exam, there is no trismus, she does have erythema of the posterior oropharynx with some right-sided tonsillar fullness.  With her hoarseness unilateral pain in this fullness I do question whether there is a PTA or phlegmon.  CT of the neck with contrast was obtained which is negative for abscess but does show prominent tonsils.  She was given Toradol and Decadron.  Feeling improved after medication.  Her strep and viral studies are negative.  Likely viral pharyngitis.  Will discharge with supportive care.  Clinical Course as of 02/11/21 1235  Sat Feb 11, 2021  1226   IMPRESSION: 1. Prominent bilateral palatine tonsils and uvula may reflect sequela of tonsillitis. There is no definite evidence of abscess formation. 2. Normal epiglottis and patent airway.   [KM]    Clinical Course User Index [KM] Georga Hacking, MD     ____________________________________________   FINAL CLINICAL IMPRESSION(S) / ED DIAGNOSES  Final diagnoses:  Viral pharyngitis     ED Discharge  Orders     None        Note:  This document was prepared using Dragon voice recognition software and may include unintentional dictation errors.    Georga Hacking, MD 02/11/21 1235

## 2021-02-11 NOTE — ED Triage Notes (Signed)
Pt reports sore throat, hoarse voice and SOB for several days. Denies cough, congestion or recent exposures. Has been using nyquil throat spray

## 2021-02-11 NOTE — Discharge Instructions (Signed)
Your strep test and COVID and influenza test were negative.  The CAT scan did not show any abscess.  You likely have a throat infection from a virus.  You can continue to take ibuprofen and Tylenol for pain.

## 2021-02-22 ENCOUNTER — Ambulatory Visit (LOCAL_COMMUNITY_HEALTH_CENTER): Payer: Self-pay | Admitting: Family Medicine

## 2021-02-22 ENCOUNTER — Ambulatory Visit: Payer: Self-pay

## 2021-02-22 ENCOUNTER — Other Ambulatory Visit: Payer: Self-pay

## 2021-02-22 ENCOUNTER — Encounter: Payer: Self-pay | Admitting: Family Medicine

## 2021-02-22 VITALS — BP 132/78 | HR 80 | Ht 66.0 in | Wt 119.8 lb

## 2021-02-22 DIAGNOSIS — Z3009 Encounter for other general counseling and advice on contraception: Secondary | ICD-10-CM

## 2021-02-22 DIAGNOSIS — N760 Acute vaginitis: Secondary | ICD-10-CM

## 2021-02-22 DIAGNOSIS — Z0000 Encounter for general adult medical examination without abnormal findings: Secondary | ICD-10-CM

## 2021-02-22 DIAGNOSIS — Z3200 Encounter for pregnancy test, result unknown: Secondary | ICD-10-CM

## 2021-02-22 DIAGNOSIS — B9689 Other specified bacterial agents as the cause of diseases classified elsewhere: Secondary | ICD-10-CM

## 2021-02-22 DIAGNOSIS — Z113 Encounter for screening for infections with a predominantly sexual mode of transmission: Secondary | ICD-10-CM

## 2021-02-22 LAB — PREGNANCY, URINE: Preg Test, Ur: NEGATIVE

## 2021-02-22 LAB — WET PREP FOR TRICH, YEAST, CLUE
Trichomonas Exam: NEGATIVE
Yeast Exam: NEGATIVE

## 2021-02-22 NOTE — Progress Notes (Signed)
Patient here for STD testing and for PE. Wet prep reviewed, no tx per protocol. Due to a delay in time and the providers schedule, patient was unable to be seen by a provider. No PE completed during this visit. PCP list given to patient.

## 2021-02-23 MED ORDER — METRONIDAZOLE 500 MG PO TABS: 500.0000 mg | ORAL_TABLET | Freq: Two times a day (BID) | ORAL | 0 refills | Status: AC

## 2021-02-23 NOTE — Patient Instructions (Signed)
Steps to prevent BV and yeast: Wear all-cotton underwear Sleep without underwear Take showers instead of baths Wear loose fitting clothing, especially during warm/hot weather Use a hair dryer on low after bathing to dry the area Avoid scented soaps and body washes Do not douche May try over the counter probiotics or boric acid gel or suppositories Stop smoking  

## 2021-02-23 NOTE — Progress Notes (Signed)
[facility] DEPARTMENT [facility] [address] Main Number: [phone]  Family Planning Visit- Repeat Yearly Visit  Subjective:  Sara Castillo is a 25 y.o. [phone]  being seen today for an annual wellness visit and to discuss contraception options.   The patient is currently using No Method - No Contraceptive Precautions for pregnancy prevention. Patient does not want a pregnancy in the next year. Patient has the following medical problems: does not have a problem list on file.  Chief Complaint  Patient presents with   Annual Exam    Patient reports here for physical, and STI testing     See flowsheet for other program required questions.   Body mass index is 19.34 kg/m. - Patient is eligible for diabetes screening based on BMI and age >6?  no HA1C ordered? no  Patient reports 1 of partners in last year. Desires STI screening?  Yes   Has patient been screened once for HCV in the past?  No  No results found for: HCVAB  Does the patient have current of drug use, have a partner with drug use, and/or has been incarcerated since last result? Yes  If yes-- Screen for HCV through [facility]   Does the patient meet criteria for HBV testing? Yes  Criteria:  -Household, sexual or needle sharing contact with HBV -History of drug use -HIV positive -Those with known Hep C   Health Maintenance Due  Topic Date Due   COVID-19 Vaccine (1) Never done   Pneumococcal Vaccine 53-54 Years old (1 - PCV) Never done   Hepatitis C Screening  Never done   PAP-Cervical Cytology Screening  Never done   PAP SMEAR-Modifier  Never done   INFLUENZA VACCINE  Never done    Review of Systems  Constitutional:  Negative for chills, fever, malaise/fatigue and weight loss.  HENT:  Negative for congestion, hearing loss and sore throat.   Eyes:  Negative for blurred vision, double vision and photophobia.  Respiratory:  Negative for shortness of breath.    Cardiovascular:  Negative for chest pain.  Gastrointestinal:  Negative for abdominal pain, blood in stool, constipation, diarrhea, heartburn, nausea and vomiting.  Genitourinary:  Negative for dysuria and frequency.  Musculoskeletal:  Negative for back pain, joint pain and neck pain.  Skin:  Negative for itching and rash.  Neurological:  Negative for dizziness, weakness and headaches.  Endo/Heme/Allergies:  Does not bruise/bleed easily.  Psychiatric/Behavioral:  Negative for depression, substance abuse and suicidal ideas.    The following portions of the patient's history were reviewed and updated as appropriate: allergies, current medications, past family history, past medical history, past social history, past surgical history and problem list. Problem list updated.  Objective:   Vitals:   02/22/21 1553  BP: 132/78  Pulse: 80  Weight: 119 lb 12.8 oz (54.3 kg)  Height: 5\' 6"  (1.676 m)    Physical Exam Vitals and nursing note reviewed.  Constitutional:      Appearance: Normal appearance.  HENT:     Head: Normocephalic and atraumatic.     Mouth/Throat:     Mouth: Mucous membranes are moist.     Pharynx: No oropharyngeal exudate or posterior oropharyngeal erythema.  Eyes:     General: No scleral icterus. Cardiovascular:     Rate and Rhythm: Normal rate.     Pulses: Normal pulses.  Pulmonary:     Effort: Pulmonary effort is normal.  Abdominal:     General: Abdomen  is flat. Bowel sounds are normal.     Palpations: Abdomen is soft.  Genitourinary:    Comments: Deferred  Musculoskeletal:        General: Normal range of motion.     Cervical back: Normal range of motion and neck supple.  Skin:    General: Skin is warm and dry.  Neurological:     General: No focal deficit present.     Mental Status: She is alert and oriented to person, place, and time.  Psychiatric:        Mood and Affect: Mood normal.        Behavior: Behavior normal.      Assessment and Plan:   Sara Castillo is a 25 y.o. female G2P0111 presenting to the [facility] Department for an yearly wellness and contraception visit  Contraception counseling: Reviewed all forms of birth control options in the tiered based approach. available including abstinence; over the counter/barrier methods; hormonal contraceptive medication including pill, patch, ring, injection,contraceptive implant, ECP; hormonal and nonhormonal IUDs; permanent sterilization options including vasectomy and the various tubal sterilization modalities. Risks, benefits, and typical effectiveness rates were reviewed.  Questions were answered.  Written information was also given to the patient to review.  Patient desires no BCM , this was prescribed for patient. She will follow up as need  for surveillance.   Pt  was told to call with any further questions, or with any concerns about this method of contraception.  Emphasized use of condoms 100% of the time for STI prevention.  Patient was not offered ECP based on pt desire.    1. Screening examination for venereal disease  - HIV [place] LAB - Syphilis Serology, [place] Lab - Chlamydia/Gonorrhea [place] Lab - WET PREP FOR TRICH, YEAST, CLUE  2. Encounter for pregnancy test, result unknown  - Pregnancy, urine  3. Routine general medical examination at a health care facility Well woman exam today  Pap was done 05/11/20 per patient,  Pt reported several things on ROS that need to be seen by PCP.  Will refer pt to [facility].    4. BV (bacterial vaginosis)  - metroNIDAZOLE (FLAGYL) 500 MG tablet; Take 1 tablet (500 mg total) by mouth 2 (two) times daily for 7 days.  Dispense: 14 tablet; Refill: 0   Return for as needed.  No future appointments.  Wendi Snipes, FNP

## 2021-03-01 ENCOUNTER — Telehealth: Payer: Self-pay | Admitting: Gerontology

## 2021-03-01 NOTE — Telephone Encounter (Signed)
Called patient to inform her of the application process

## 2021-03-03 NOTE — Progress Notes (Signed)
Chart reviewed by Pharmacist  Suzanne Walker PharmD, Contract Pharmacist at [facility]  

## 2021-03-07 ENCOUNTER — Ambulatory Visit: Payer: Self-pay

## 2021-03-07 ENCOUNTER — Telehealth: Payer: Self-pay

## 2021-03-07 ENCOUNTER — Other Ambulatory Visit: Payer: Self-pay

## 2021-03-07 DIAGNOSIS — A549 Gonococcal infection, unspecified: Secondary | ICD-10-CM

## 2021-03-07 DIAGNOSIS — A749 Chlamydial infection, unspecified: Secondary | ICD-10-CM

## 2021-03-07 MED ORDER — AZITHROMYCIN 500 MG PO TABS
1000.0000 mg | ORAL_TABLET | Freq: Once | ORAL | Status: AC
  Administered 2021-03-07: 1000 mg via ORAL

## 2021-03-07 MED ORDER — CEFTRIAXONE SODIUM 500 MG IJ SOLR
500.0000 mg | Freq: Once | INTRAMUSCULAR | Status: AC
  Administered 2021-03-07: 500 mg via INTRAMUSCULAR

## 2021-03-07 NOTE — Telephone Encounter (Signed)
Calling pt regarding positive GC and positive chlamydia result from 02/22/21 vaginal specimen.  Pt needs tx appt.  Phone call to pt. Pt confirmed password from last visit. Pt counseled about results from 02/22/21 specimen. Pt states she is not taking BC and has NKA.  Pt counseled to eat before coming in for appt and tx. Pt requested appt for today; tx scheduled for 03/07/21.

## 2021-03-07 NOTE — Progress Notes (Signed)
In Nurse Clinic for chlamydia and gonorrhea treatment. On no birth control method. Last sex approx 02/10/21. LMP 02/11/21. Treated today with Azithromycin 1 gram po DOT and Ceftriaxone 500 mg IM per [facility] Dr Karyl Kinnier. Tolerated well. Advised to contact [facility] if vomits within 2 hrs of taking Azithromycin. Pt refuses to stay for 20 min observation after injection. Pt has 29 yo daughter with her and says she's too restless to stay any longer. Jerel Shepherd, RN

## 2021-03-07 NOTE — Telephone Encounter (Signed)
Pt treated 03/07/21.

## 2021-06-29 ENCOUNTER — Emergency Department: Payer: Self-pay

## 2021-06-29 ENCOUNTER — Other Ambulatory Visit: Payer: Self-pay

## 2021-06-29 ENCOUNTER — Emergency Department
Admission: EM | Admit: 2021-06-29 | Discharge: 2021-06-29 | Disposition: A | Discharge status: Home / Self Care | Payer: Self-pay | Attending: Emergency Medicine | Admitting: Emergency Medicine

## 2021-06-29 ENCOUNTER — Encounter: Payer: Self-pay | Admitting: Emergency Medicine

## 2021-06-29 DIAGNOSIS — R110 Nausea: Secondary | ICD-10-CM | POA: Insufficient documentation

## 2021-06-29 DIAGNOSIS — D649 Anemia, unspecified: Secondary | ICD-10-CM | POA: Insufficient documentation

## 2021-06-29 DIAGNOSIS — N730 Acute parametritis and pelvic cellulitis: Secondary | ICD-10-CM | POA: Insufficient documentation

## 2021-06-29 LAB — CHLAMYDIA/NGC RT PCR (ARMC ONLY)
Chlamydia Tr: NOT DETECTED
N gonorrhoeae: NOT DETECTED

## 2021-06-29 LAB — CBC WITH DIFFERENTIAL/PLATELET
Abs Immature Granulocytes: 0.01 10*3/uL (ref 0.00–0.07)
Basophils Absolute: 0 10*3/uL (ref 0.0–0.1)
Basophils Relative: 1 %
Eosinophils Absolute: 0.2 10*3/uL (ref 0.0–0.5)
Eosinophils Relative: 3 %
HCT: 35.9 % — ABNORMAL LOW (ref 36.0–46.0)
Hemoglobin: 11.5 g/dL — ABNORMAL LOW (ref 12.0–15.0)
Immature Granulocytes: 0 %
Lymphocytes Relative: 49 %
Lymphs Abs: 2.6 10*3/uL (ref 0.7–4.0)
MCH: 29.3 pg (ref 26.0–34.0)
MCHC: 32 g/dL (ref 30.0–36.0)
MCV: 91.6 fL (ref 80.0–100.0)
Monocytes Absolute: 0.4 10*3/uL (ref 0.1–1.0)
Monocytes Relative: 8 %
Neutro Abs: 2.1 10*3/uL (ref 1.7–7.7)
Neutrophils Relative %: 39 %
Platelets: 251 10*3/uL (ref 150–400)
RBC: 3.92 MIL/uL (ref 3.87–5.11)
RDW: 14.3 % (ref 11.5–15.5)
WBC: 5.3 10*3/uL (ref 4.0–10.5)
nRBC: 0 % (ref 0.0–0.2)

## 2021-06-29 LAB — COMPREHENSIVE METABOLIC PANEL
ALT: 12 U/L (ref 0–44)
AST: 18 U/L (ref 15–41)
Albumin: 3.8 g/dL (ref 3.5–5.0)
Alkaline Phosphatase: 75 U/L (ref 38–126)
Anion gap: 5 (ref 5–15)
BUN: 13 mg/dL (ref 6–20)
CO2: 24 mmol/L (ref 22–32)
Calcium: 8.6 mg/dL — ABNORMAL LOW (ref 8.9–10.3)
Chloride: 109 mmol/L (ref 98–111)
Creatinine, Ser: 0.59 mg/dL (ref 0.44–1.00)
GFR, Estimated: >60 mL/min (ref >60)
Glucose, Bld: 89 mg/dL (ref 70–99)
Potassium: 4 mmol/L (ref 3.5–5.1)
Sodium: 138 mmol/L (ref 135–145)
Total Bilirubin: 0.5 mg/dL (ref 0.3–1.2)
Total Protein: 6.7 g/dL (ref 6.5–8.1)

## 2021-06-29 LAB — WET PREP, GENITAL
Sperm: NONE SEEN
Trich, Wet Prep: NONE SEEN
WBC, Wet Prep HPF POC: >=10 — AB (ref <10)
Yeast Wet Prep HPF POC: NONE SEEN

## 2021-06-29 LAB — URINALYSIS, ROUTINE W REFLEX MICROSCOPIC
Bilirubin Urine: NEGATIVE
Glucose, UA: NEGATIVE mg/dL
Hgb urine dipstick: NEGATIVE
Ketones, ur: NEGATIVE mg/dL
Leukocytes,Ua: NEGATIVE
Nitrite: NEGATIVE
Protein, ur: NEGATIVE mg/dL
Specific Gravity, Urine: 1.021 (ref 1.005–1.030)
pH: 6 (ref 5.0–8.0)

## 2021-06-29 LAB — LIPASE, BLOOD: Lipase: 36 U/L (ref 11–51)

## 2021-06-29 LAB — POC URINE PREG, ED: Preg Test, Ur: NEGATIVE

## 2021-06-29 LAB — HCG, QUANTITATIVE, PREGNANCY: hCG, Beta Chain, Quant, S: <1 m[IU]/mL (ref <5)

## 2021-06-29 MED ORDER — LIDOCAINE HCL (PF) 1 % IJ SOLN
5.0000 mL | Freq: Once | INTRAMUSCULAR | Status: AC
Start: 2021-06-29 — End: 2021-06-29
  Administered 2021-06-29: 5 mL
  Filled 2021-06-29: qty 5

## 2021-06-29 MED ORDER — DOXYCYCLINE MONOHYDRATE 100 MG PO TABS: 100.0000 mg | ORAL_TABLET | Freq: Two times a day (BID) | ORAL | 0 refills | Status: AC

## 2021-06-29 MED ORDER — ONDANSETRON 4 MG PO TBDP: 4.0000 mg | ORAL_TABLET | Freq: Three times a day (TID) | ORAL | 0 refills | Status: AC | PRN

## 2021-06-29 MED ORDER — ACETAMINOPHEN 500 MG PO TABS
1000.0000 mg | ORAL_TABLET | Freq: Once | ORAL | Status: AC
Start: 2021-06-29 — End: 2021-06-29
  Administered 2021-06-29: 1000 mg via ORAL
  Filled 2021-06-29: qty 2

## 2021-06-29 MED ORDER — CEFTRIAXONE SODIUM 1 G IJ SOLR
500.0000 mg | Freq: Once | INTRAMUSCULAR | Status: AC
  Administered 2021-06-29: 500 mg via INTRAMUSCULAR
  Filled 2021-06-29: qty 10

## 2021-06-29 MED ORDER — METRONIDAZOLE 500 MG PO TABS: 500.0000 mg | ORAL_TABLET | Freq: Two times a day (BID) | ORAL | 0 refills | Status: AC

## 2021-06-29 NOTE — Discharge Instructions (Addendum)
?  We are starting you on some antibiotics for possible pelvic inflammatory disease.  Do not drink alcohol with it.  You can take Tylenol 1 g every 8 hours and ibuprofen 600 every 6-8 hours to help with pain ? ? ? ?No sexual activity for 2 weeks; ?Follow up results in mychart-Treat all partners who had sex with patient during previous 60 days prior to symptom onset ? ? ? ?IMPRESSION: ?1. 2.6 cm right ovarian cyst with 6 mm avascular nodule. Need ?follow-up to exclude neoplastic cyst, recommend pelvic ultrasound ?follow-up in 12 weeks. ?2. Normal ovarian blood flow.  No acute finding. ?  ?

## 2021-06-29 NOTE — ED Triage Notes (Signed)
Patient ambulatory to triage with steady gait, without difficulty or distress noted; pt reports that she awoke with left lower abd pain; st hx ovarian cysts ?

## 2021-06-29 NOTE — ED Provider Notes (Signed)
? ?[facility] ?Provider Note ? ? ? Event Date/Time  ? First MD Initiated Contact with Patient 06/29/21 [phone]   ?  (approximate) ? ? ?History  ? ?Abdominal Pain ? ? ?HPI ? ?Sara Castillo is a 26 y.o. female with history ovarian cyst who comes in with concerns for left lower quadrant pain.  Patient reports pain that started overnight, severe with some associated nausea.  She denies taking any medication to try to help with the pain.  She does not report completing treatment for her gonorrhea and chlamydia and she has not had sex since then until 3 weeks ago she had sex with the same partner.  However she reports that partner had been treated as well.  However she does report a little bit of vaginal discharge as well.  Denies any fevers.  No pain in the right lower quadrant mostly to the left lower quadrant ? ? ?On review of records patient was positive for gonorrhea and chlamydia on 02/22/2021 ? ? ?Physical Exam  ? ?Triage Vital Signs: ?ED Triage Vitals  ?Enc Vitals Group  ?   BP 06/29/21 0415 117/87  ?   Pulse Rate 06/29/21 0415 66  ?   Resp 06/29/21 0415 18  ?   Temp 06/29/21 0415 98.3 ?F (36.8 ?C)  ?   Temp Source 06/29/21 0415 Oral  ?   SpO2 06/29/21 0415 98 %  ?   Weight 06/29/21 0416 120 lb (54.4 kg)  ?   Height 06/29/21 0416 5\' 6"  (1.676 m)  ?   Head Circumference --   ?   Peak Flow --   ?   Pain Score 06/29/21 0416 9  ?   Pain Loc --   ?   Pain Edu? --   ?   Excl. in GC? --   ? ? ?Most recent vital signs: ?Vitals:  ? 06/29/21 0415 06/29/21 0728  ?BP: 117/87 139/85  ?Pulse: 66 68  ?Resp: 18 18  ?Temp: 98.3 ?F (36.8 ?C) 98.2 ?F (36.8 ?C)  ?SpO2: 98% 98%  ? ? ? ?General: Awake, no distress.  ?CV:  Good peripheral perfusion.  ?Resp:  Normal effort.  ?Abd:  No distention.  Nontender in the right lower quadrant with some left lower quadrant tenderness. ?Other: Patient had a lot of discharge noted in the vault difficult to visualize the cervix but did have cervical motion tenderness on  manual exam as well as left adnexal tenderness on examination  ? ? ?ED Results / Procedures / Treatments  ? ?Labs ?(all labs ordered are listed, but only abnormal results are displayed) ?Labs Reviewed  ?CBC WITH DIFFERENTIAL/PLATELET - Abnormal; Notable for the following components:  ?    Result Value  ? Hemoglobin 11.5 (*)   ? HCT 35.9 (*)   ? All other components within normal limits  ?COMPREHENSIVE METABOLIC PANEL - Abnormal; Notable for the following components:  ? Calcium 8.6 (*)   ? All other components within normal limits  ?URINALYSIS, ROUTINE W REFLEX MICROSCOPIC - Abnormal; Notable for the following components:  ? Color, Urine YELLOW (*)   ? APPearance HAZY (*)   ? All other components within normal limits  ?LIPASE, BLOOD  ?POC URINE PREG, ED  ? ? ? ?RADIOLOGY ?I have reviewed the ultrasound personally or patient has a cyst on the right ovary ? ? ? ?PROCEDURES: ? ?Critical Care performed: No ? ?Procedures ? ? ?MEDICATIONS ORDERED IN ED: ?Medications - No data to display ? ? ?  IMPRESSION / MDM / ASSESSMENT AND PLAN / ED COURSE  ?I reviewed the triage vital signs and the nursing notes. ? ?Patient comes in with left lower quadrant pain concerning for possible ectopic, pregnancy, ovarian cyst, ovarian torsion, PID. ? ?Pregnancy test was negative ?CBC shows normal white count hemoglobin slightly low but similar to prior ?CMP is stable ?Lipase is stable ?UA without evidence of UTI ?Pregnancy test is negative ? ? ?IMPRESSION: ?1. 2.6 cm right ovarian cyst with 6 mm avascular nodule. Need ?follow-up to exclude neoplastic cyst, recommend pelvic ultrasound ?follow-up in 12 weeks. ?2. Normal ovarian blood flow.  No acute finding. ?  ? ?Discussed this incidental finding with patient need for follow-up ultrasound she expressed understanding but her pain is on the left side not the right side.  ? ?Patient's pelvic exam concerning for PID.  Ultrasound without any evidence of tubo-ovarian abscess and patient afebrile.   Patient will be given dose of ceftriaxone and Tylenol and wait for wet prep results. ? ?Wet prep positive for clue cells therefore will start with Flagyl.  Discussed avoiding alcohol we discussed avoiding sexual activity for 14 days and having gonorrhea and chlamydia followed up on MyChart to decide if partners need to be treated ? ? ?The patient is on the cardiac monitor to evaluate for evidence of arrhythmia and/or significant heart rate changes. ? ?  ? ? ?FINAL CLINICAL IMPRESSION(S) / ED DIAGNOSES  ? ?Final diagnoses:  ?PID (acute pelvic inflammatory disease)  ? ? ? ?Rx / DC Orders  ? ?ED Discharge Orders   ? ?      Ordered  ?  doxycycline (ADOXA) 100 MG tablet  2 times daily       ? 06/29/21 0912  ?  metroNIDAZOLE (FLAGYL) 500 MG tablet  2 times daily       ? 06/29/21 0912  ?  ondansetron (ZOFRAN-ODT) 4 MG disintegrating tablet  Every 8 hours PRN       ? 06/29/21 0912  ? ?  ?  ? ?  ? ? ? ?Note:  This document was prepared using Dragon voice recognition software and may include unintentional dictation errors. ?  ?Concha Se, MD ?06/29/21 [phone] ? ?

## 2022-03-20 ENCOUNTER — Ambulatory Visit: Payer: Self-pay | Admitting: Family

## 2022-03-20 DIAGNOSIS — A599 Trichomoniasis, unspecified: Secondary | ICD-10-CM

## 2022-03-20 DIAGNOSIS — Z113 Encounter for screening for infections with a predominantly sexual mode of transmission: Secondary | ICD-10-CM

## 2022-03-20 LAB — WET PREP FOR TRICH, YEAST, CLUE
Trichomonas Exam: POSITIVE — AB
Yeast Exam: NEGATIVE

## 2022-03-20 LAB — HM HIV SCREENING LAB: HM HIV Screening: NEGATIVE

## 2022-03-20 MED ORDER — METRONIDAZOLE 500 MG PO TABS: 500.0000 mg | ORAL_TABLET | Freq: Two times a day (BID) | ORAL | 0 refills | Status: AC

## 2022-03-20 NOTE — Progress Notes (Unsigned)
Pt visit for STI screening. Seen by [facility]. Initial results positive for Trich. Medication dispensed per order and instructions provided.

## 2022-03-22 ENCOUNTER — Encounter: Payer: Self-pay | Admitting: Family

## 2022-03-22 NOTE — Progress Notes (Signed)
[facility] Department  STI clinic/screening visit [place] [address] [phone]  Subjective:  Sara Castillo is a 27 y.o. female being seen today for an STI screening visit. The patient reports they do have symptoms.  Patient reports that they do not desire a pregnancy in the next year.   They reported they are not interested in discussing contraception today.    Patient's last menstrual period was 03/06/2022 (approximate).  Patient has the following medical conditions:  There are no problems to display for this patient.   Chief Complaint  Patient presents with   SEXUALLY TRANSMITTED DISEASE    Screening for STIs -greenish discharge     HPI  Patient states having the same symptoms last time when she had gonorrhea and chlamydia last year from the same partner.  Reports foul smelling thick yellow discharge with vaginal irritation and painful sex x 2 weeks. Does the patient using douching products? No  Last HIV test per patient/review of record was  Lab Results  Component Value Date   HMHIVSCREEN Negative - Validated 08/05/2020   No results found for: "HIV" Patient reports last pap was No results found for: "DIAGPAP"   Screening for MPX risk: Does the patient have an unexplained rash? No Is the patient MSM? No Does the patient endorse multiple sex partners or anonymous sex partners? No Did the patient have close or sexual contact with a person diagnosed with MPX? No Has the patient traveled outside the [place] where MPX is endemic? No Is there a high clinical suspicion for MPX-- evidenced by one of the following No  -Unlikely to be chickenpox  -Lymphadenopathy  -Rash that present in same phase of evolution on any given body part See flowsheet for further details and programmatic requirements.   Immunization history:  Immunization History  Administered Date(s) Administered   Tdap 02/22/2016     The following portions of the  patient's history were reviewed and updated as appropriate: allergies, current medications, past medical history, past social history, past surgical history and problem list.  Objective:  There were no vitals filed for this visit.  STD Exam performed per state guidelines.  Negative except vagina with thick yellow malodorous discharge pH<4.5.   Assessment and Plan:  Sara Castillo is a 27 y.o. female presenting to the [facility] Department for STI screening  1. Screening for venereal disease  - Chlamydia/Gonorrhea [place] Lab - HIV [place] LAB - Syphilis Serology,  Lab - WET PREP FOR [place], YEAST, CLUE  2. Trichomoniasis Treat per standing orders Give partner card x 1 Abstain from sex for 2 weeks. Use condoms for all sex thereafter Retest in 3 months  Patient accepted all screenings including vaginal CT/GC and bloodwork for HIV/RPR, and wet prep. Patient meets criteria for HepB screening? No. Ordered? no Patient meets criteria for HepC screening? No. Ordered? no  Treat wet prep per standing order Discussed time line for State Lab results and that patient will be called with positive results and encouraged patient to call if she had not heard in 2 weeks.  Counseled to return or seek care for continued or worsening symptoms Recommended condom use with all sex  Patient is currently using nothing to prevent pregnancy.    Return if symptoms worsen or fail to improve.  No future appointments.  Marline Backbone, FNP

## 2022-03-29 ENCOUNTER — Ambulatory Visit: Payer: Self-pay

## 2022-05-29 IMAGING — US US PELVIS COMPLETE WITH TRANSVAGINAL
1 series · 14 of 25 positions shown · non-contrast
Comparison: None

CLINICAL DATA: Left lower quadrant pain.

EXAM:
TRANSABDOMINAL AND TRANSVAGINAL ULTRASOUND OF PELVIS
TECHNIQUE: Both transabdominal and transvaginal ultrasound examinations of the
pelvis were performed. Transabdominal technique was performed for
global imaging of the pelvis including uterus, ovaries, adnexal
regions, and pelvic cul-de-sac. It was necessary to proceed with
endovaginal exam following the transabdominal exam to visualize the
bilateral ovaries.

[Series 2: gyn us · 14 of 55 slices shown]
[im 1/55]
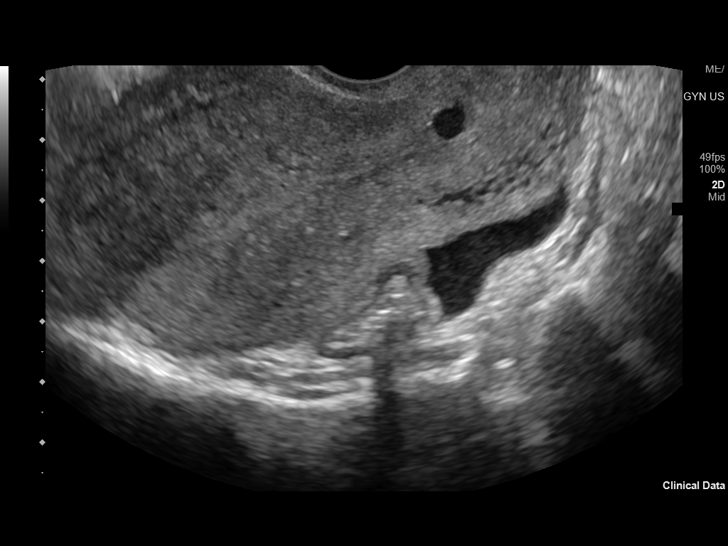
[im 5/55]
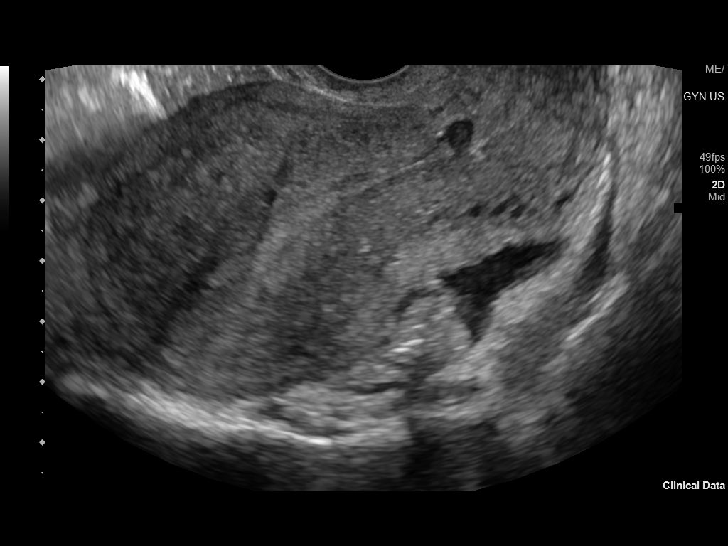
[im 10/55]
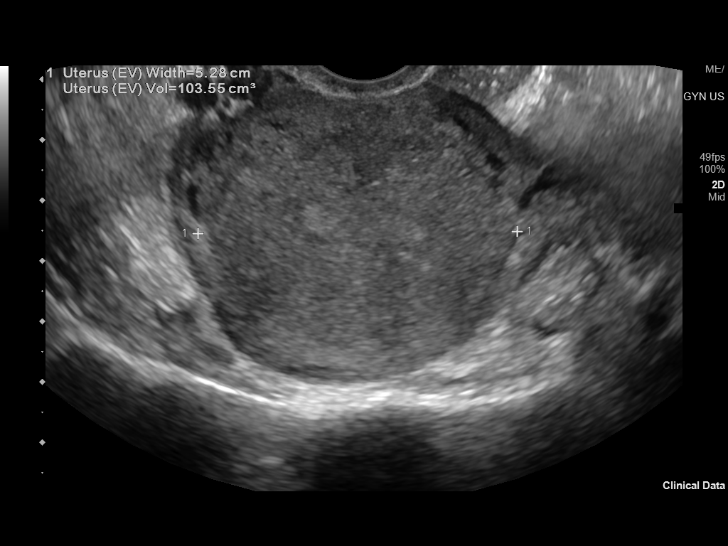
[im 14/55]
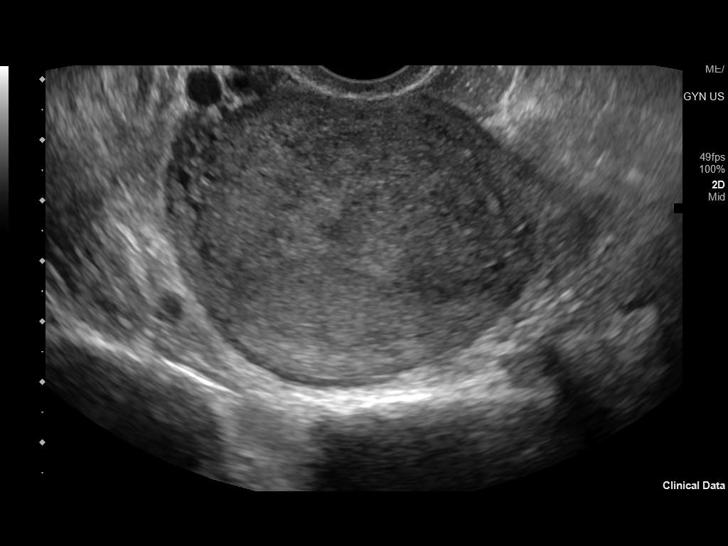
[im 19/55]
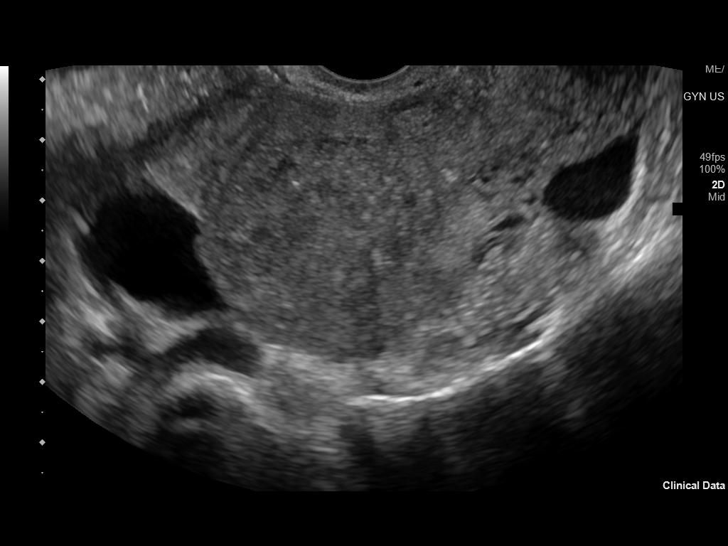
[im 21/55]
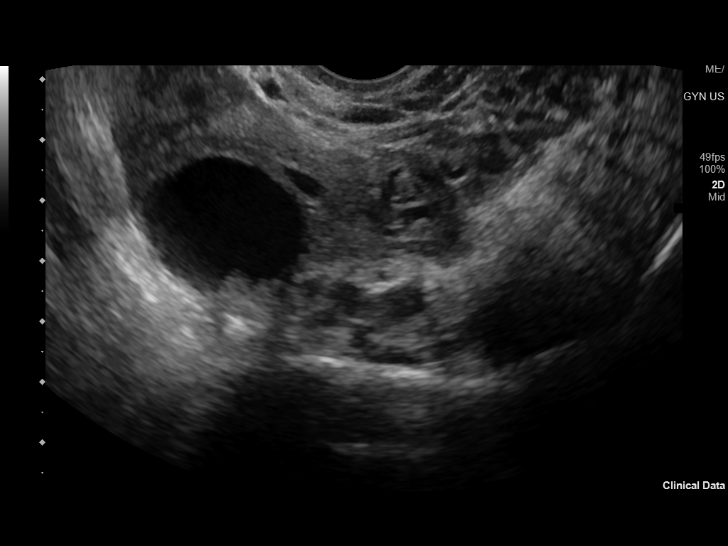
[im 25/55]
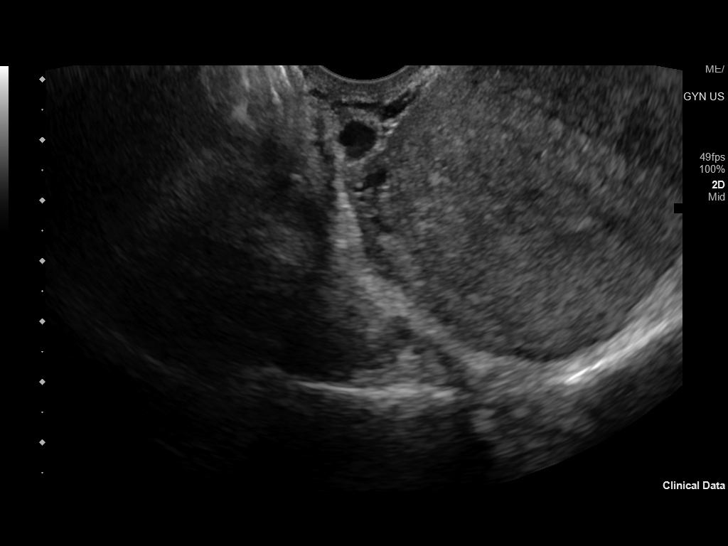
[im 30/55]
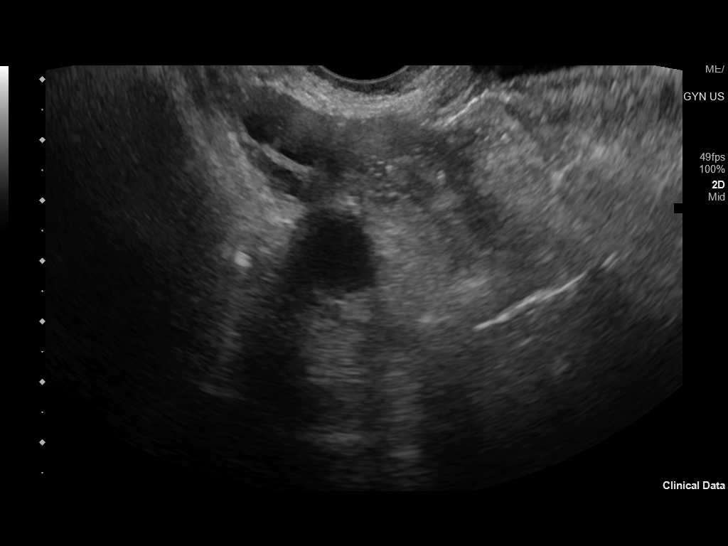
[im 34/55]
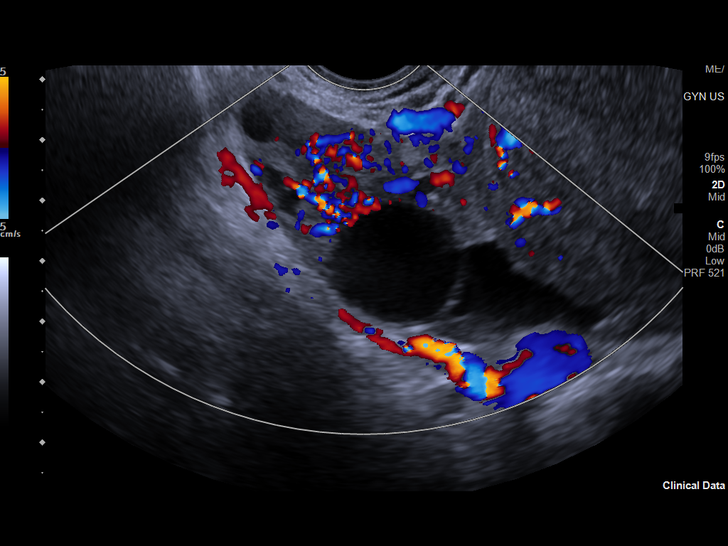
[im 37/55]
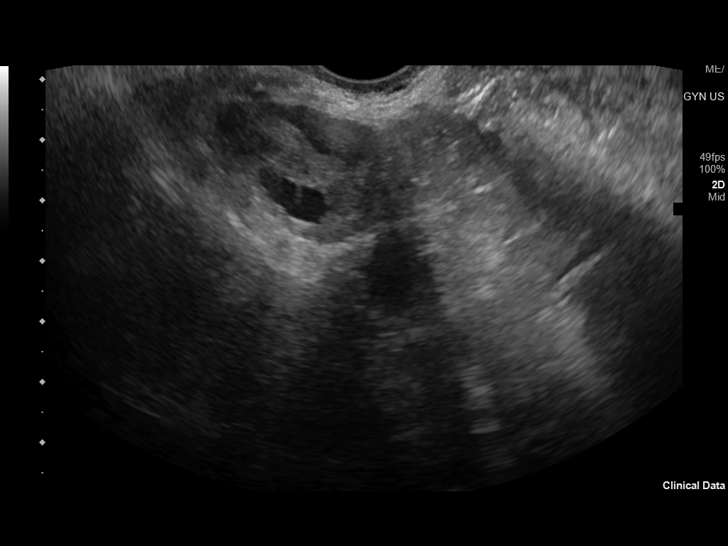
[im 41/55]
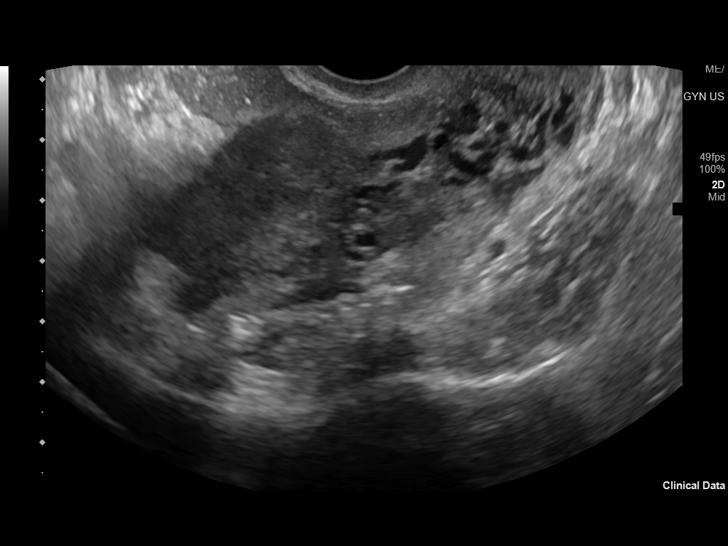
[im 46/55]
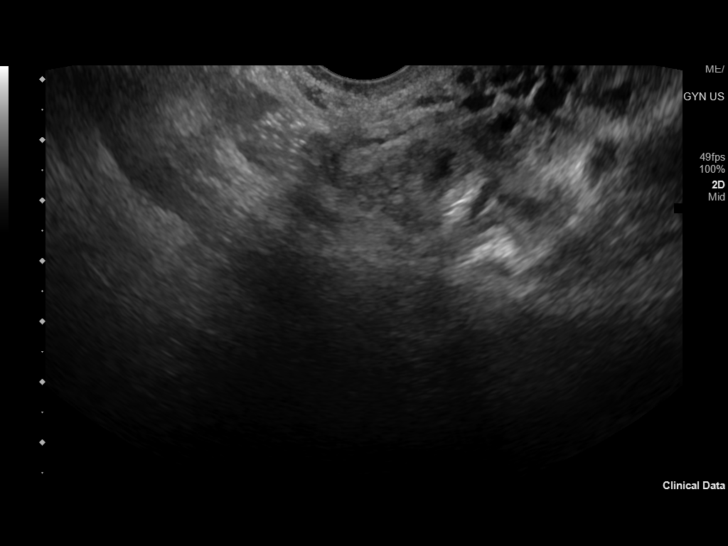
[im 50/55]
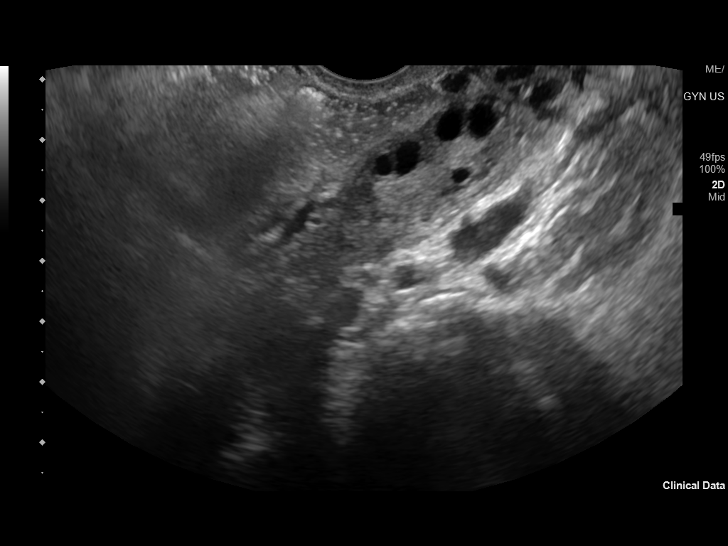
[im 55/55]
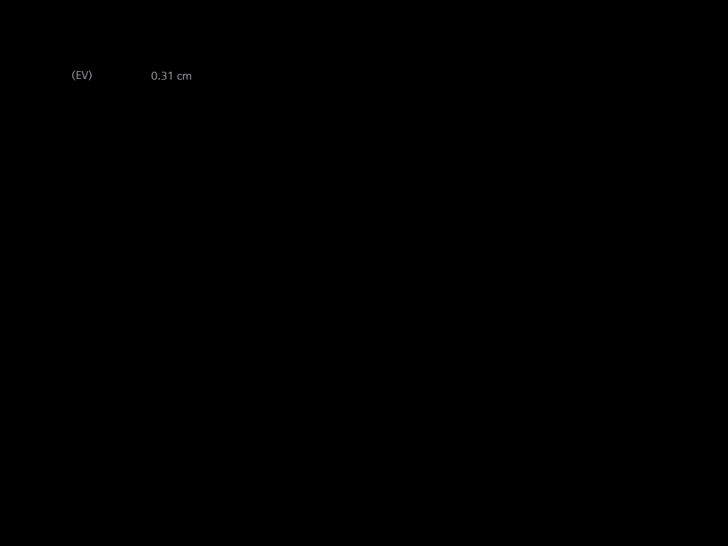

[14 of 25 positions shown; findings below may reference images not displayed]

FINDINGS: Uterus

Measurements: 7.7 cm x 4.9 cm x 5.3 cm = volume: 103.6 mL. No
fibroids or other mass visualized.

Endometrium

Thickness: 3.1 mm.  No focal abnormality visualized.

Right ovary

Measurements: 4.8 cm x 2.9 cm x 2.7 cm = volume: 19.5 mL. Normal
appearance/no adnexal mass.

Left ovary

Measurements: 4.7 cm x 1.7 cm x 2.1 cm = volume: 8.7 mL. Normal
appearance/no adnexal mass.

Other findings

A small amount of pelvic and right para ovarian free fluid is seen.
IMPRESSION: Small amount of pelvic and right para ovarian free fluid.

## 2022-12-03 ENCOUNTER — Ambulatory Visit: Payer: 59 | Admitting: Family Medicine

## 2022-12-03 ENCOUNTER — Encounter: Payer: Self-pay | Admitting: Family Medicine

## 2022-12-03 ENCOUNTER — Ambulatory Visit: Payer: 59

## 2022-12-03 VITALS — BP 125/85 | HR 84 | Ht 66.0 in | Wt 113.8 lb

## 2022-12-03 DIAGNOSIS — B9689 Other specified bacterial agents as the cause of diseases classified elsewhere: Secondary | ICD-10-CM

## 2022-12-03 DIAGNOSIS — Z3009 Encounter for other general counseling and advice on contraception: Secondary | ICD-10-CM | POA: Diagnosis not present

## 2022-12-03 DIAGNOSIS — Z113 Encounter for screening for infections with a predominantly sexual mode of transmission: Secondary | ICD-10-CM

## 2022-12-03 DIAGNOSIS — Z0000 Encounter for general adult medical examination without abnormal findings: Secondary | ICD-10-CM | POA: Diagnosis not present

## 2022-12-03 LAB — WET PREP FOR TRICH, YEAST, CLUE
Trichomonas Exam: NEGATIVE
Yeast Exam: NEGATIVE

## 2022-12-03 LAB — HM HIV SCREENING LAB: HM HIV Screening: NEGATIVE

## 2022-12-03 MED ORDER — METRONIDAZOLE 500 MG PO TABS: 500.0000 mg | ORAL_TABLET | Freq: Two times a day (BID) | ORAL | Status: AC

## 2022-12-03 MED ORDER — NORGESTIM-ETH ESTRAD TRIPHASIC 0.18/0.215/0.25 MG-25 MCG PO TABS: 1.0000 | ORAL_TABLET | Freq: Every day | ORAL | 3 refills | Status: DC

## 2022-12-03 NOTE — Progress Notes (Signed)
[facility] Department  STI clinic/screening visit [address] [place] [address] [phone]  Subjective:  Sara Castillo is a 27 y.o. female being seen today for an STI screening visit. The patient reports they do have symptoms.  Patient reports that they do not desire a pregnancy in the next year.   They reported they are not interested in discussing contraception today.    Patient's last menstrual period was 11/30/2022 (exact date).  Patient has the following medical conditions:  There are no problems to display for this patient.   Chief Complaint  Patient presents with   SEXUALLY TRANSMITTED DISEASE    Screening    HPI  Patient reports to clinic for STI testing- reports discharge and odor, believes this is BV  Does the patient using douching products? No  Last HIV test per patient/review of record was  Lab Results  Component Value Date   HMHIVSCREEN Negative - Validated 03/20/2022   No results found for: "HIV"   Last HEPC test per patient/review of record was No results found for: "HMHEPCSCREEN" No components found for: "HEPC"   Last HEPB test per patient/review of record was No components found for: "HMHEPBSCREEN" No components found for: "HEPC"   Patient reports last pap was No results found for: "DIAGPAP" No results found for: "SPECADGYN"  Screening for MPX risk: Does the patient have an unexplained rash? No Is the patient MSM? No Does the patient endorse multiple sex partners or anonymous sex partners? No Did the patient have close or sexual contact with a person diagnosed with MPX? No Has the patient traveled outside the [place] where MPX is endemic? No Is there a high clinical suspicion for MPX-- evidenced by one of the following No  -Unlikely to be chickenpox  -Lymphadenopathy  -Rash that present in same phase of evolution on any given body part See flowsheet for further details and programmatic requirements.   Immunization  history:  Immunization History  Administered Date(s) Administered   Tdap 02/22/2016     The following portions of the patient's history were reviewed and updated as appropriate: allergies, current medications, past medical history, past social history, past surgical history and problem list.  Objective:  There were no vitals filed for this visit.  Physical Exam Vitals and nursing note reviewed. Exam conducted with a chaperone present Sara Rocher RN).  Constitutional:      Appearance: Normal appearance.  HENT:     Head: Normocephalic and atraumatic.     Mouth/Throat:     Mouth: Mucous membranes are moist.     Pharynx: Oropharynx is clear. No oropharyngeal exudate or posterior oropharyngeal erythema.  Pulmonary:     Effort: Pulmonary effort is normal.  Abdominal:     General: Abdomen is flat.     Palpations: There is no mass.     Tenderness: There is no abdominal tenderness. There is no rebound.  Genitourinary:    General: Normal vulva.     Exam position: Lithotomy position.     Pubic Area: No rash or pubic lice.      Labia:        Right: No rash or lesion.        Left: No rash or lesion.      Vagina: Bleeding present. No vaginal discharge, erythema or lesions.     Cervix: No cervical motion tenderness, discharge, friability, lesion or erythema.     Uterus: Normal.      Adnexa: Right adnexa normal and  left adnexa normal.     Rectum: Normal.     Comments: pH = 5  Strong odor present  Menses in vaginal canal Lymphadenopathy:     Head:     Right side of head: No preauricular or posterior auricular adenopathy.     Left side of head: No preauricular or posterior auricular adenopathy.     Cervical: No cervical adenopathy.     Upper Body:     Right upper body: No supraclavicular, axillary or epitrochlear adenopathy.     Left upper body: No supraclavicular, axillary or epitrochlear adenopathy.     Lower Body: No right inguinal adenopathy. No left inguinal adenopathy.   Skin:    General: Skin is warm and dry.     Findings: No rash.  Neurological:     Mental Status: She is alert and oriented to person, place, and time.    Assessment and Plan:  Sara Castillo is a 27 y.o. female presenting to the [facility] Department for STI screening  1. Screening for venereal disease  - Chlamydia/Gonorrhea [place] Lab - HIV [place] LAB - Syphilis Serology, [place] Lab - WET PREP FOR TRICH, YEAST, CLUE   Patient accepted all screenings including vaginal CT/GC and bloodwork for HIV/RPR, and wet prep. Patient meets criteria for HepB screening? No. Ordered? not applicable Patient meets criteria for HepC screening? No. Ordered? not applicable  Treat wet prep per standing order Discussed time line for State Lab results and that patient will be called with positive results and encouraged patient to call if she had not heard in 2 weeks.  Counseled to return or seek care for continued or worsening symptoms Recommended repeat testing in 3 months with positive results. Recommended condom use with all sex  Patient is currently using  nothing  to prevent pregnancy.    Return if symptoms worsen or fail to improve, for STI screening.  No future appointments. Total time spent 30 minutes    Lenice Llamas, [place]

## 2022-12-03 NOTE — Progress Notes (Signed)
Pt is here for STD screening.   The patient was dispensed Metronidazole 500 mg #14 today. I provided counseling today regarding the medication. We discussed the medication, the side effects and when to call clinic. Patient given the opportunity to ask questions. Questions answered. Condoms declined.  Berdie Ogren, RN

## 2022-12-03 NOTE — Progress Notes (Signed)
   [facility] Problem Visit  [facility]  Subjective:  Sara Castillo is a 27 y.o. being seen today for   Chief Complaint  Patient presents with   Contraception    Wants BC    HPI  Seen for STI visit, desires contraception. Currently on period.   Health Maintenance Due  Topic Date Due   Hepatitis C Screening  Never done   INFLUENZA VACCINE  Never done   COVID-19 Vaccine (1 - 2023-24 season) Never done    ROS  The following portions of the patient's history were reviewed and updated as appropriate: allergies, current medications, past family history, past medical history, past social history, past surgical history and problem list. Problem list updated.   See flowsheet for other program required questions.  Objective:   Vitals:   12/03/22 1332  BP: 125/85  Pulse: 84  Weight: 113 lb 12.8 oz (51.6 kg)  Height: 5\' 6"  (1.676 m)    Physical Exam  See STI visit  Assessment and Plan:  Sara Castillo is a 27 y.o. female presenting to the [facility] Department for a Women's Health problem visit  1. Family planning  - Norgestimate-Ethinyl Estradiol Triphasic (TRI-LO-SPRINTEC) 0.18/0.215/0.25 MG-25 MCG tab; Take 1 tablet by mouth daily.  Dispense: 28 tablet; Refill: 3     No follow-ups on file.  No future appointments.  Lenice Llamas, [place]

## 2023-08-10 IMAGING — US US PELVIS COMPLETE TRANSABD/TRANSVAG W DUPLEX AND/OR DOPPLER
1 series · 13 of 25 positions shown · non-contrast
Comparison: 04/17/2020

CLINICAL DATA: Lower abdominal pain this morning. Negative urine
pregnancy test

EXAM:
TRANSABDOMINAL AND TRANSVAGINAL ULTRASOUND OF PELVIS
DOPPLER ULTRASOUND OF OVARIES
TECHNIQUE: Both transabdominal and transvaginal ultrasound examinations of the
pelvis were performed. Transabdominal technique was performed for
global imaging of the pelvis including uterus, ovaries, adnexal
regions, and pelvic cul-de-sac.
It was necessary to proceed with endovaginal exam following the
transabdominal exam to visualize the ovaries. Color and duplex
Doppler ultrasound was utilized to evaluate blood flow to the
ovaries.

[Series 1: us pelvic complete w transvaginal and torsion righ · 13 of 106 slices shown]
[im 1/106]
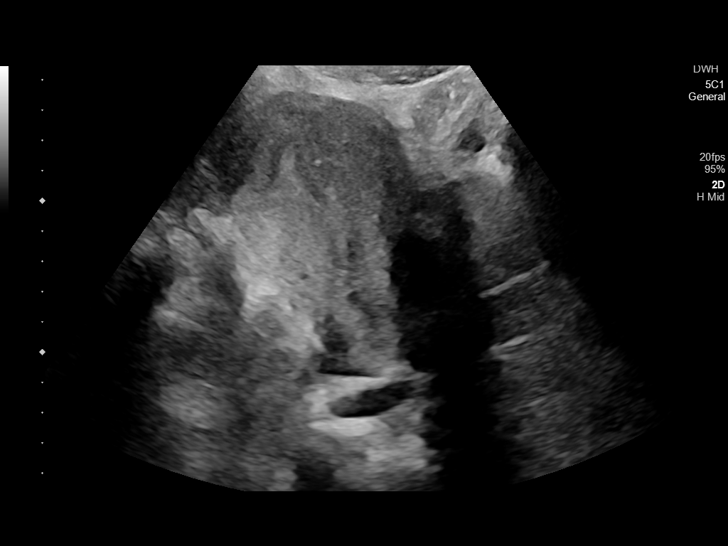
[im 9/106]
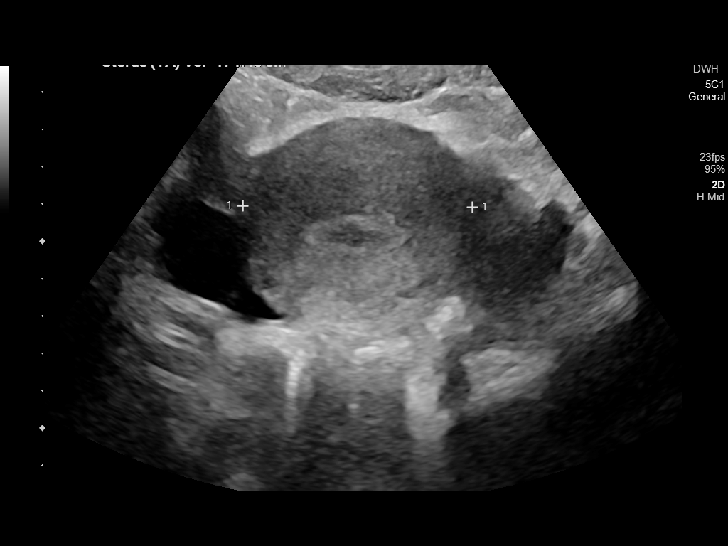
[im 18/106]
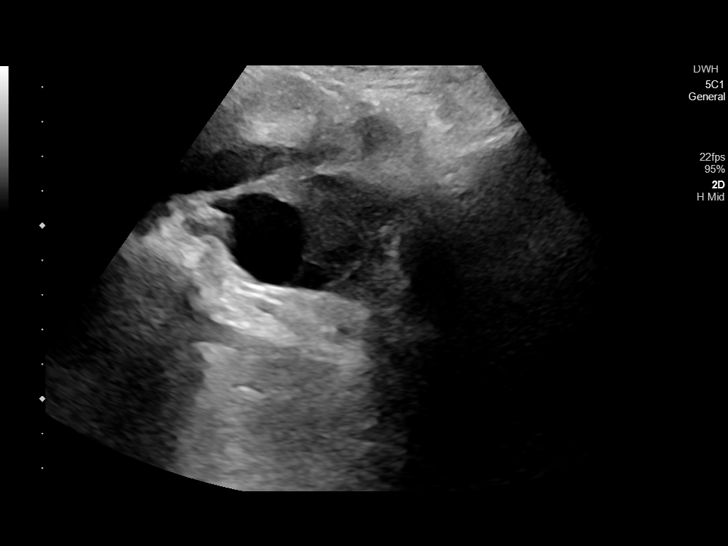
[im 27/106]
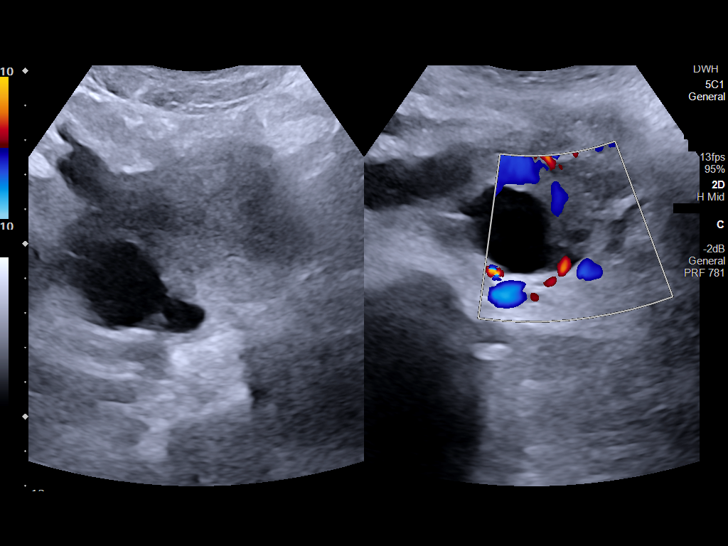
[im 36/106]
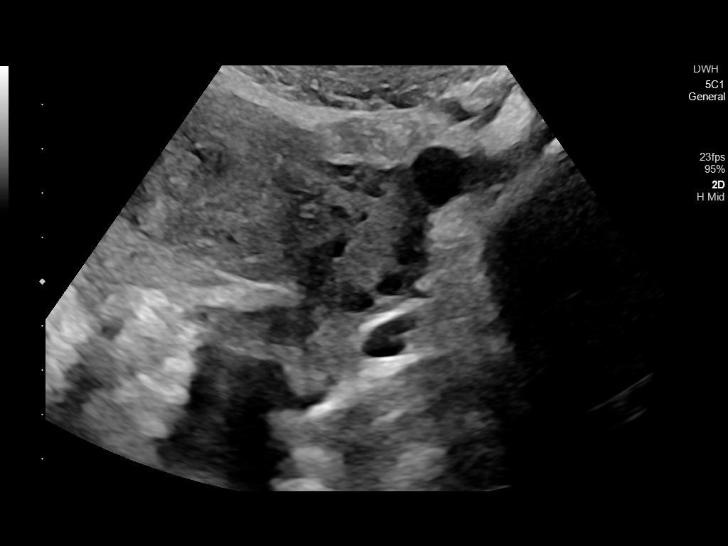
[im 44/106]
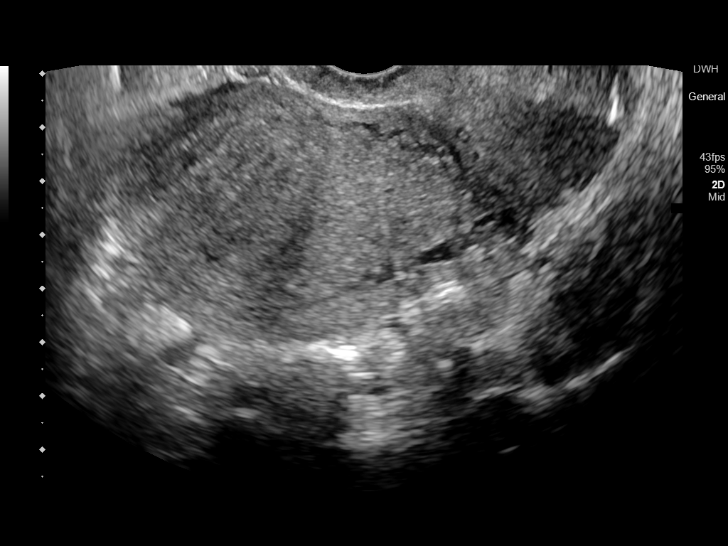
[im 53/106]
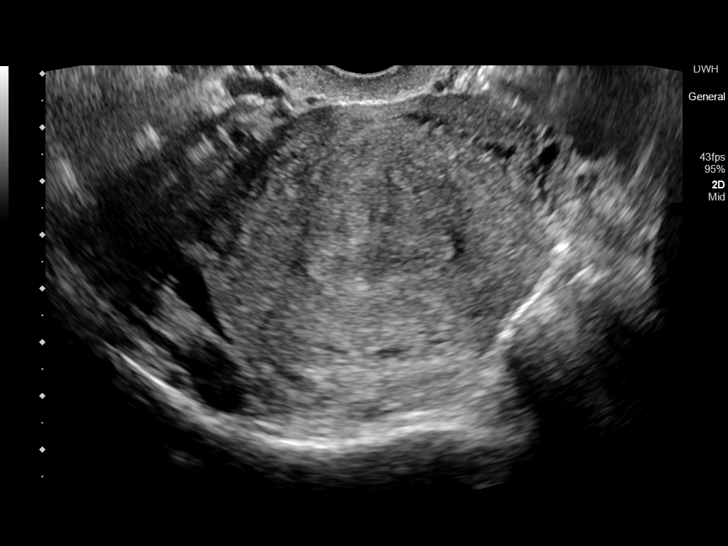
[im 62/106]
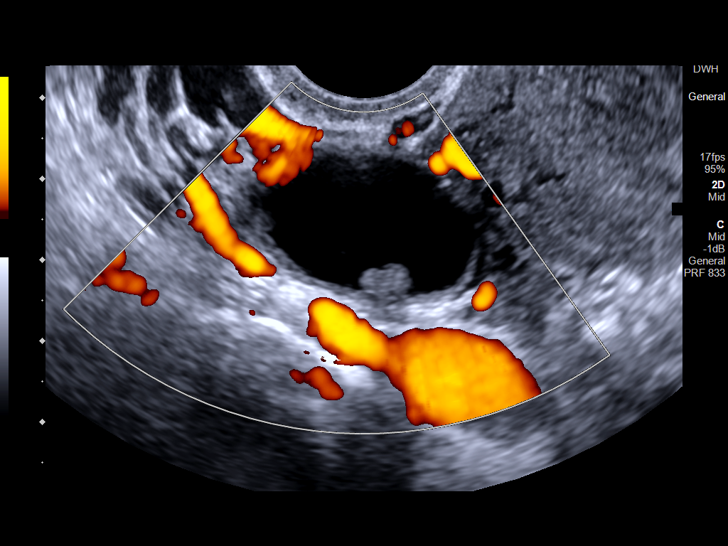
[im 71/106]
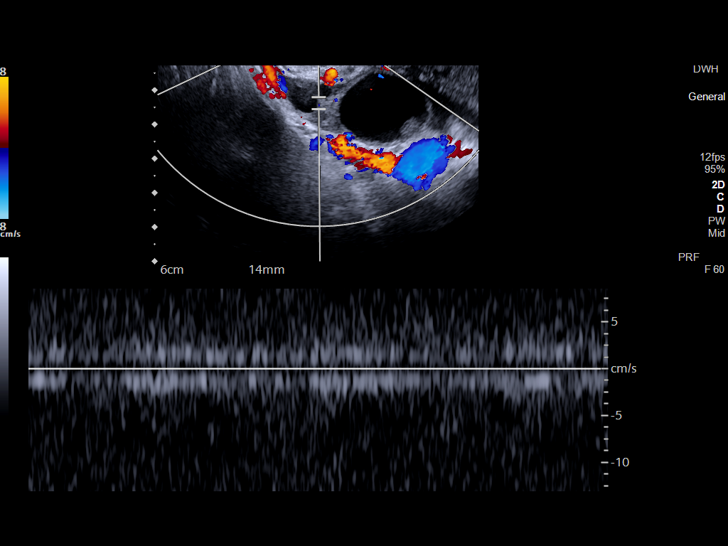
[im 79/106]
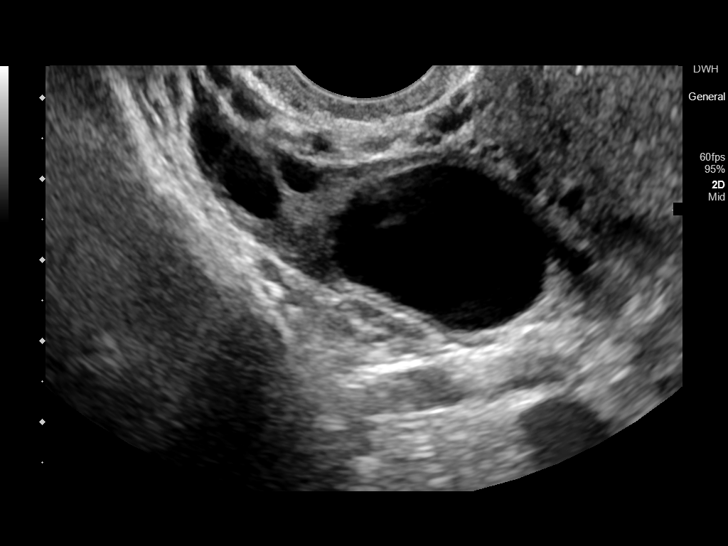
[im 88/106]
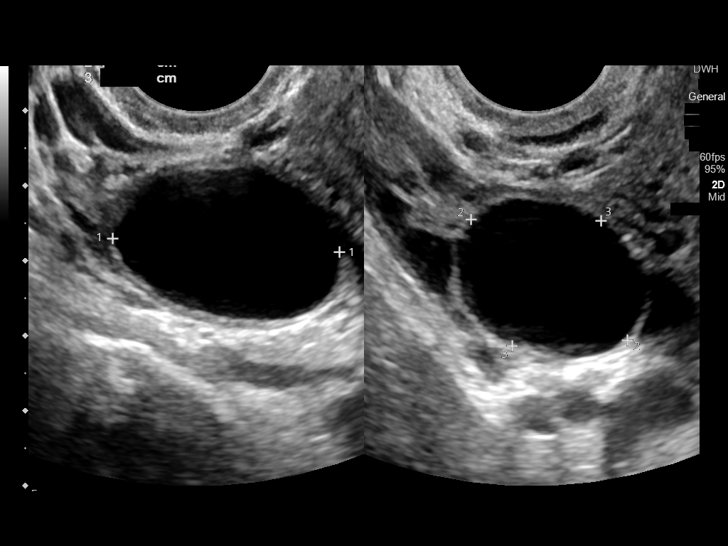
[im 97/106]
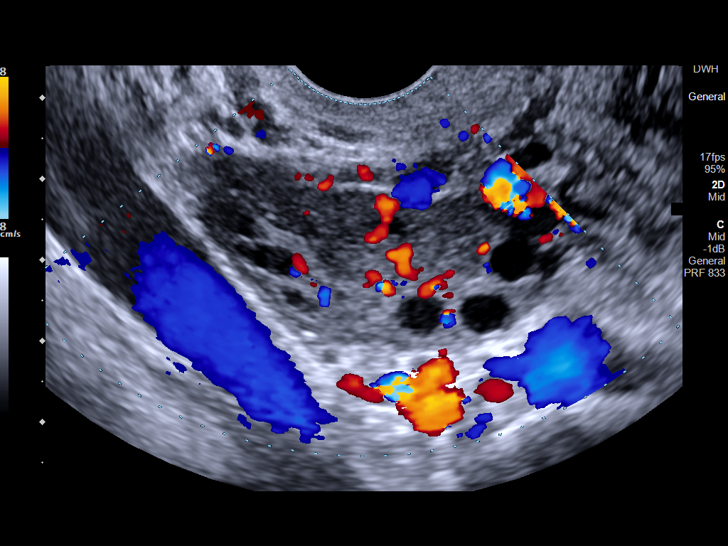
[im 106/106]
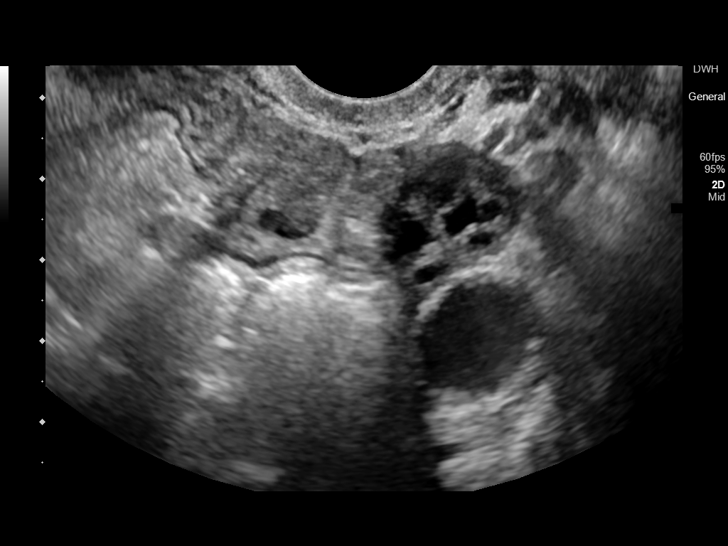

[13 of 25 positions shown; findings below may reference images not displayed]

FINDINGS: Uterus

Measurements: 10 x 6 x 6 cm = volume: 170 mL. No fibroids or other
mass visualized.

Endometrium

Thickness: 11 mm.  No focal abnormality visualized.

Right ovary

Measurements: 52 x 27 x 35 mm = volume: 26 mL. 2.6 cm cyst with
internal nodule not showing any visible Doppler flow.

Left ovary

Measurements: 4.6 x 2.2 x 2.4 cm = volume: 13 mL. Normal
appearance/no adnexal mass.

Pulsed Doppler evaluation of both ovaries demonstrates normal
low-resistance arterial and venous waveforms.

Other findings

Small volume and simple pelvic fluid
IMPRESSION: 1. 2.6 cm right ovarian cyst with 6 mm avascular nodule. Need
follow-up to exclude neoplastic cyst, recommend pelvic ultrasound
follow-up in 12 weeks.
2. Normal ovarian blood flow.  No acute finding.

## 2023-09-17 ENCOUNTER — Ambulatory Visit: Admission: EM | Admit: 2023-09-17 | Discharge: 2023-09-17 | Disposition: A | Discharge status: Home / Self Care

## 2023-09-17 ENCOUNTER — Encounter: Payer: Self-pay | Admitting: Emergency Medicine

## 2023-09-17 DIAGNOSIS — S060X0A Concussion without loss of consciousness, initial encounter: Secondary | ICD-10-CM

## 2023-09-17 DIAGNOSIS — Z23 Encounter for immunization: Secondary | ICD-10-CM

## 2023-09-17 DIAGNOSIS — S0081XA Abrasion of other part of head, initial encounter: Secondary | ICD-10-CM

## 2023-09-17 DIAGNOSIS — S0990XA Unspecified injury of head, initial encounter: Secondary | ICD-10-CM

## 2023-09-17 MED ORDER — TETANUS-DIPHTH-ACELL PERTUSSIS 5-2.5-18.5 LF-MCG/0.5 IM SUSY
0.5000 mL | PREFILLED_SYRINGE | Freq: Once | INTRAMUSCULAR | Status: AC
  Administered 2023-09-17: 0.5 mL via INTRAMUSCULAR

## 2023-09-17 NOTE — Discharge Instructions (Addendum)
 Rest is much as possible.  Avoid tablets, computer screens, gaming systems, or phones as the refrigerate might make your headache worse.  Limit television time to no more than 2, 30-minute shows a day.  You may read printed books.  Use over-the-counter Tylenol  and ibuprofen  according to package instructions as needed for headache.  You have no dietary restrictions.  Keep the wound on your forehead clean and dry.  Apply a thin smear of bacitracin twice daily to help prevent infection.  Once the scab has formed you can stop applying bacitracin ointment and leave it open to air.  You may apply sunscreen to prevent pigmentation of the skin as it heals to minimize scarring.  Wearing a widebrimmed hat to prevent direct sun exposure can also be helpful.  If you develop any change in behavior, severe headache relieved with Tylenol  and ibuprofen , forceful nausea and vomiting, unequal pupils, or lethargy please go to the ER for evaluation.

## 2023-09-17 NOTE — ED Triage Notes (Signed)
 Pt hit the corner of her car door today trying avoid a bee. She denies LOC, but reports dizziness.

## 2023-09-17 NOTE — ED Provider Notes (Addendum)
 [facility]    CSN: [id number] Arrival date & time: 09/17/23  9074      History   Chief Complaint Chief Complaint  Patient presents with   Head Injury    HPI Sara Castillo is a 28 y.o. female.   HPI  28 year old female with past medical history significant for ovarian cyst presents for evaluation of a closed head injury.  She reports that she was dropping her daughter off at daycare and when she got out of a car there were a lot of bees around her car.  She placed her daughter back into the car and while she was trying to get into the car several be swarmed around her head and she went obstructing her head on the corner of the door.  She did not have a loss of consciousness.  Initially her vision was blurry but that resolved.  She does have a mild headache but she denies nausea, vomiting, numbness, tingling, or weakness.  Patient's last documented Tdap in epic was 2017.  Past Medical History:  Diagnosis Date   Ovarian cyst 04/15/2020    There are no active problems to display for this patient.   History reviewed. No pertinent surgical history.  OB History     Gravida  2   Para  1   Term  0   Preterm  1   AB  1   Living  1      SAB      IAB      Ectopic      Multiple  1   Live Births  1            Home Medications    Prior to Admission medications   Medication Sig Start Date End Date Taking? Authorizing Provider  levonorgestrel (MIRENA) 20 MCG/DAY IUD 1 each by Intrauterine route. 05/03/23  Yes [provider]    Family History Family History  Problem Relation Age of Onset   Diabetes Paternal Grandfather    Hypertension Maternal Grandfather    Healthy Father    Healthy Mother    Healthy Brother    Healthy Sister     Social History Social History   Tobacco Use   Smoking status: Former    Current packs/day: 0.25    Types: Cigarettes   Smokeless tobacco: Never  Vaping Use   Vaping status: Every Day    Substances: Nicotine  Substance Use Topics   Alcohol use: Yes    Comment: occas.   Drug use: Yes    Types: Marijuana    Comment: 3x a day     Allergies   Patient has no known allergies.   Review of Systems Review of Systems  Eyes:  Positive for photophobia and visual disturbance.  Gastrointestinal:  Negative for nausea and vomiting.  Skin:  Positive for wound.  Neurological:  Positive for headaches. Negative for dizziness and syncope.     Physical Exam Triage Vital Signs ED Triage Vitals  Encounter Vitals Group     BP 09/17/23 0938 136/83     Girls Systolic BP Percentile --      Girls Diastolic BP Percentile --      Boys Systolic BP Percentile --      Boys Diastolic BP Percentile --      Pulse Rate 09/17/23 0938 69     Resp 09/17/23 0938 18     Temp 09/17/23 0938 98.8 F (37.1 C)     Temp Source  09/17/23 0938 Oral     SpO2 09/17/23 0938 98 %     Weight --      Height --      Head Circumference --      Peak Flow --      Pain Score 09/17/23 0936 9     Pain Loc --      Pain Education --      Exclude from Growth Chart --    No data found.  Updated Vital Signs BP 136/83 (BP Location: Right Arm)   Pulse 69   Temp 98.8 F (37.1 C) (Oral)   Resp 18   SpO2 98%   Visual Acuity Right Eye Distance:   Left Eye Distance:   Bilateral Distance:    Right Eye Near:   Left Eye Near:    Bilateral Near:     Physical Exam Vitals and nursing note reviewed.  Constitutional:      Appearance: Normal appearance. She is not ill-appearing.  HENT:     Head: Normocephalic.     Mouth/Throat:     Mouth: Mucous membranes are moist.     Pharynx: Oropharynx is clear. No oropharyngeal exudate or posterior oropharyngeal erythema.  Eyes:     General: No scleral icterus.       Right eye: No discharge.        Left eye: No discharge.     Extraocular Movements: Extraocular movements intact.     Pupils: Pupils are equal, round, and reactive to light.  Musculoskeletal:         General: Signs of injury present.  Skin:    General: Skin is warm and dry.     Capillary Refill: Capillary refill takes less than 2 seconds.     Findings: Bruising present. No erythema.  Neurological:     General: No focal deficit present.     Mental Status: She is alert and oriented to person, place, and time.     Cranial Nerves: No cranial nerve deficit.     Sensory: No sensory deficit.     Motor: No weakness.     Coordination: Coordination normal.      UC Treatments / Results  Labs (all labs ordered are listed, but only abnormal results are displayed) Labs Reviewed - No data to display  EKG   Radiology No results found.  Procedures Procedures (including critical care time)  Medications Ordered in UC Medications  Tdap (BOOSTRIX) injection 0.5 mL (has no administration in time range)    Initial Impression / Assessment and Plan / UC Course  I have reviewed the triage vital signs and the nursing notes.  Pertinent labs & imaging results that were available during my care of the patient were reviewed by me and considered in my medical decision making (see chart for details).   Patient is a very pleasant, nontoxic-appearing 28 year old female presenting for evaluation of a closed head injury as outlined in the HPI above.  As you can see the above, there is a skin tear to the superior central forehead.  No active bleeding.  Patient's last tetanus shot documented in epic was 2017 so I will have staff update tetanus here.  There is nothing to pull together so I will have staff clean the wound and apply bacitracin ointment.  Have advised her to keep it clean and dry and apply bacitracin or Neosporin ointment for the next 1 to 2 days until scab has formed.  Once the scab is formed she can leave  it open to air.  Once the scab is formed she can apply sunscreen to prevent it from pigmentation and minimize scarring.  I have also suggested that she wear a widebrimmed hat to prevent  pigmentation from UV radiation.  Her cranial nerves II through XII are grossly intact.  She is moving all extremities independently.  She is able to accurately recall the history of the events leading up to the injury.  Her pupils are equal and reactive and EOM is intact.  She is photosensitive to direct light.  Based on her exam she does have a mild concussion.  I given her precautions for home concussion as well as ER precautions.  Work note provided.   Final Clinical Impressions(s) / UC Diagnoses   Final diagnoses:  Minor head injury without loss of consciousness, initial encounter  Concussion without loss of consciousness, initial encounter  Abrasion of forehead, initial encounter     Discharge Instructions      Rest is much as possible.  Avoid tablets, computer screens, gaming systems, or phones as the refrigerate might make your headache worse.  Limit television time to no more than 2, 30-minute shows a day.  You may read printed books.  Use over-the-counter Tylenol  and ibuprofen  according to package instructions as needed for headache.  You have no dietary restrictions.  Keep the wound on your forehead clean and dry.  Apply a thin smear of bacitracin twice daily to help prevent infection.  Once the scab has formed you can stop applying bacitracin ointment and leave it open to air.  You may apply sunscreen to prevent pigmentation of the skin as it heals to minimize scarring.  Wearing a widebrimmed hat to prevent direct sun exposure can also be helpful.  If you develop any change in behavior, severe headache relieved with Tylenol  and ibuprofen , forceful nausea and vomiting, unequal pupils, or lethargy please go to the ER for evaluation.      ED Prescriptions   None    PDMP not reviewed this encounter.   Bernardino Ditch, NP 09/17/23 9041    Bernardino Ditch, NP 09/17/23 9040    Bernardino Ditch, NP 09/17/23 1000
